# Patient Record
Sex: Female | Born: 1965
Health system: Southern US, Community
[De-identification: ages and names within clinical notes are randomized; demographics above are authoritative.]

## PROBLEM LIST (undated history)

## (undated) DIAGNOSIS — C44319 Basal cell carcinoma of skin of other parts of face: Secondary | ICD-10-CM

## (undated) DIAGNOSIS — F419 Anxiety disorder, unspecified: Secondary | ICD-10-CM

## (undated) DIAGNOSIS — J40 Bronchitis, not specified as acute or chronic: Secondary | ICD-10-CM

## (undated) DIAGNOSIS — H8110 Benign paroxysmal vertigo, unspecified ear: Secondary | ICD-10-CM

## (undated) DIAGNOSIS — C50919 Malignant neoplasm of unspecified site of unspecified female breast: Secondary | ICD-10-CM

## (undated) DIAGNOSIS — T7840XA Allergy, unspecified, initial encounter: Secondary | ICD-10-CM

## (undated) DIAGNOSIS — C44211 Basal cell carcinoma of skin of unspecified ear and external auricular canal: Secondary | ICD-10-CM

## (undated) HISTORY — PX: CERVICAL CONE BIOPSY: SUR198

## (undated) HISTORY — DX: Allergy, unspecified, initial encounter: T78.40XA

## (undated) HISTORY — PX: BREAST SURGERY: SHX581

## (undated) HISTORY — DX: Basal cell carcinoma of skin of unspecified ear and external auricular canal: C44.211

## (undated) HISTORY — DX: Basal cell carcinoma of skin of other parts of face: C44.319

## (undated) HISTORY — PX: MELANOMA EXCISION: SHX5266

## (undated) HISTORY — DX: Benign paroxysmal vertigo, unspecified ear: H81.10

---

## 1998-10-11 ENCOUNTER — Inpatient Hospital Stay (HOSPITAL_COMMUNITY): Admission: AD | Admit: 1998-10-11 | Discharge: 1998-10-11 | Payer: Self-pay | Admitting: Obstetrics and Gynecology

## 1998-12-13 ENCOUNTER — Inpatient Hospital Stay (HOSPITAL_COMMUNITY): Admission: AD | Admit: 1998-12-13 | Discharge: 1998-12-17 | Payer: Self-pay | Admitting: Obstetrics and Gynecology

## 1999-01-18 ENCOUNTER — Other Ambulatory Visit: Admission: RE | Admit: 1999-01-18 | Discharge: 1999-01-18 | Payer: Self-pay | Admitting: Obstetrics and Gynecology

## 2000-02-12 ENCOUNTER — Other Ambulatory Visit: Admission: RE | Admit: 2000-02-12 | Discharge: 2000-02-12 | Payer: Self-pay | Admitting: Obstetrics and Gynecology

## 2000-06-08 HISTORY — PX: NEUROMA SURGERY: SHX722

## 2000-06-13 ENCOUNTER — Ambulatory Visit (HOSPITAL_BASED_OUTPATIENT_CLINIC_OR_DEPARTMENT_OTHER): Admission: RE | Admit: 2000-06-13 | Discharge: 2000-06-13 | Payer: Self-pay | Admitting: Orthopedic Surgery

## 2000-06-13 ENCOUNTER — Encounter (INDEPENDENT_AMBULATORY_CARE_PROVIDER_SITE_OTHER): Payer: Self-pay | Admitting: *Deleted

## 2001-03-13 ENCOUNTER — Other Ambulatory Visit: Admission: RE | Admit: 2001-03-13 | Discharge: 2001-03-13 | Payer: Self-pay | Admitting: Obstetrics and Gynecology

## 2002-03-31 ENCOUNTER — Other Ambulatory Visit: Admission: RE | Admit: 2002-03-31 | Discharge: 2002-03-31 | Payer: Self-pay | Admitting: Obstetrics and Gynecology

## 2003-04-01 ENCOUNTER — Other Ambulatory Visit: Admission: RE | Admit: 2003-04-01 | Discharge: 2003-04-01 | Payer: Self-pay | Admitting: Obstetrics and Gynecology

## 2003-07-16 ENCOUNTER — Encounter (INDEPENDENT_AMBULATORY_CARE_PROVIDER_SITE_OTHER): Payer: Self-pay | Admitting: Specialist

## 2003-07-16 ENCOUNTER — Ambulatory Visit (HOSPITAL_COMMUNITY): Admission: RE | Admit: 2003-07-16 | Discharge: 2003-07-16 | Payer: Self-pay | Admitting: Plastic Surgery

## 2003-07-16 ENCOUNTER — Ambulatory Visit (HOSPITAL_BASED_OUTPATIENT_CLINIC_OR_DEPARTMENT_OTHER): Admission: RE | Admit: 2003-07-16 | Discharge: 2003-07-16 | Payer: Self-pay | Admitting: Plastic Surgery

## 2004-01-09 ENCOUNTER — Encounter: Payer: Self-pay | Admitting: Family Medicine

## 2004-01-09 LAB — CONVERTED CEMR LAB

## 2004-05-08 ENCOUNTER — Other Ambulatory Visit: Admission: RE | Admit: 2004-05-08 | Discharge: 2004-05-08 | Payer: Self-pay | Admitting: Obstetrics and Gynecology

## 2004-06-29 ENCOUNTER — Ambulatory Visit: Payer: Self-pay | Admitting: Family Medicine

## 2005-02-19 ENCOUNTER — Ambulatory Visit: Payer: Self-pay | Admitting: Family Medicine

## 2005-03-14 ENCOUNTER — Ambulatory Visit: Payer: Self-pay | Admitting: Family Medicine

## 2005-05-11 ENCOUNTER — Ambulatory Visit: Payer: Self-pay | Admitting: Family Medicine

## 2005-10-01 ENCOUNTER — Ambulatory Visit: Payer: Self-pay | Admitting: Family Medicine

## 2006-01-02 ENCOUNTER — Ambulatory Visit: Payer: Self-pay | Admitting: Family Medicine

## 2006-08-05 ENCOUNTER — Encounter: Payer: Self-pay | Admitting: Family Medicine

## 2006-08-05 DIAGNOSIS — J309 Allergic rhinitis, unspecified: Secondary | ICD-10-CM | POA: Insufficient documentation

## 2006-08-05 DIAGNOSIS — J45909 Unspecified asthma, uncomplicated: Secondary | ICD-10-CM | POA: Insufficient documentation

## 2006-08-05 DIAGNOSIS — Z85828 Personal history of other malignant neoplasm of skin: Secondary | ICD-10-CM | POA: Insufficient documentation

## 2006-09-09 HISTORY — PX: BASAL CELL CARCINOMA EXCISION: SHX1214

## 2006-10-08 ENCOUNTER — Ambulatory Visit: Payer: Self-pay | Admitting: Family Medicine

## 2006-10-15 ENCOUNTER — Encounter (INDEPENDENT_AMBULATORY_CARE_PROVIDER_SITE_OTHER): Payer: Self-pay | Admitting: *Deleted

## 2006-10-18 ENCOUNTER — Ambulatory Visit: Payer: Self-pay | Admitting: Family Medicine

## 2006-10-21 LAB — CONVERTED CEMR LAB
Basophils Relative: 0.4 % (ref 0.0–1.0)
Bilirubin, Direct: 0.1 mg/dL (ref 0.0–0.3)
CO2: 28 meq/L (ref 19–32)
Creatinine, Ser: 0.9 mg/dL (ref 0.4–1.2)
Eosinophils Relative: 1.1 % (ref 0.0–5.0)
GFR calc Af Amer: 89 mL/min
Glucose, Bld: 96 mg/dL (ref 70–99)
HCT: 34.9 % — ABNORMAL LOW (ref 36.0–46.0)
HDL: 73.3 mg/dL (ref >39.0)
Hemoglobin: 11.8 g/dL — ABNORMAL LOW (ref 12.0–15.0)
Lymphocytes Relative: 25.2 % (ref 12.0–46.0)
Monocytes Absolute: 0.6 10*3/uL (ref 0.2–0.7)
Neutro Abs: 4.5 10*3/uL (ref 1.4–7.7)
Neutrophils Relative %: 64 % (ref 43.0–77.0)
Potassium: 4.4 meq/L (ref 3.5–5.1)
RDW: 11.9 % (ref 11.5–14.6)
Total Bilirubin: 0.5 mg/dL (ref 0.3–1.2)
Total Protein: 6.7 g/dL (ref 6.0–8.3)
VLDL: 17 mg/dL (ref 0–40)
WBC: 6.9 10*3/uL (ref 4.5–10.5)

## 2006-12-23 ENCOUNTER — Ambulatory Visit: Payer: Self-pay | Admitting: Family Medicine

## 2007-01-16 ENCOUNTER — Ambulatory Visit: Payer: Self-pay | Admitting: Family Medicine

## 2007-09-22 ENCOUNTER — Ambulatory Visit: Payer: Self-pay | Admitting: Family Medicine

## 2007-10-15 ENCOUNTER — Ambulatory Visit: Payer: Self-pay | Admitting: Family Medicine

## 2007-12-29 ENCOUNTER — Telehealth: Payer: Self-pay | Admitting: Family Medicine

## 2008-04-01 ENCOUNTER — Ambulatory Visit: Payer: Self-pay | Admitting: Family Medicine

## 2008-04-02 ENCOUNTER — Telehealth (INDEPENDENT_AMBULATORY_CARE_PROVIDER_SITE_OTHER): Payer: Self-pay | Admitting: *Deleted

## 2008-04-05 ENCOUNTER — Ambulatory Visit: Payer: Self-pay | Admitting: Family Medicine

## 2008-04-29 ENCOUNTER — Ambulatory Visit: Payer: Self-pay | Admitting: Family Medicine

## 2008-08-02 ENCOUNTER — Telehealth: Payer: Self-pay | Admitting: Family Medicine

## 2008-08-25 ENCOUNTER — Ambulatory Visit (HOSPITAL_COMMUNITY): Admission: RE | Admit: 2008-08-25 | Discharge: 2008-08-25 | Payer: Self-pay | Admitting: Obstetrics and Gynecology

## 2008-09-23 ENCOUNTER — Ambulatory Visit (HOSPITAL_COMMUNITY): Admission: RE | Admit: 2008-09-23 | Discharge: 2008-09-23 | Payer: Self-pay | Admitting: Obstetrics and Gynecology

## 2008-09-23 ENCOUNTER — Encounter (INDEPENDENT_AMBULATORY_CARE_PROVIDER_SITE_OTHER): Payer: Self-pay | Admitting: Obstetrics and Gynecology

## 2008-12-07 ENCOUNTER — Ambulatory Visit (HOSPITAL_BASED_OUTPATIENT_CLINIC_OR_DEPARTMENT_OTHER): Admission: RE | Admit: 2008-12-07 | Discharge: 2008-12-07 | Payer: Self-pay | Admitting: Plastic Surgery

## 2009-02-28 ENCOUNTER — Ambulatory Visit: Payer: Self-pay | Admitting: Family Medicine

## 2009-02-28 DIAGNOSIS — H8110 Benign paroxysmal vertigo, unspecified ear: Secondary | ICD-10-CM | POA: Insufficient documentation

## 2010-01-30 ENCOUNTER — Encounter: Payer: Self-pay | Admitting: Obstetrics and Gynecology

## 2010-02-07 NOTE — Assessment & Plan Note (Signed)
Summary: ROA FOR VERTIGO SYMPTOMS/JRR   Vital Signs:  Patient profile:   45 year old female Height:      69.25 inches Weight:      200.75 pounds BMI:     29.54 Temp:     97.8 degrees F oral Pulse rate:   64 / minute Pulse rhythm:   regular BP sitting:   90 / 60  (right arm) Cuff size:   regular  Vitals Entered By: Linde Gillis CMA Duncan Dull) (February 28, 2009 8:59 AM) CC: follow-up visit, vertigo   History of Present Illness: she has had vertigo throughout life on and off  is sensitive to it with age   seems worse lately with swimming  ? water in her ears  no congestion or allergy symptoms however   vertigo -- feels like she spins or sways  did vomit once at swim practice -- nausea occasionally  tries not to turn head fast  even moving eyes from mirrior to mirror in car bothers her   will be flying to [place] in april - will need meds for play  has taken vistaril in past for boat rides  shot in the past too   has never went to ENT  also had some general hearing loss  Allergies: No Known Drug Allergies  Past History:  Past Medical History: Last updated: 10/08/2006 Allergic rhinitis Asthma- cough Skin cancer, hx of - basal cell on forehead, and L ear  Past Surgical History: Last updated: 10/08/2006 Cervical conization Pre melanoma removed Caesarean section (12/1998) Neuroma removed from finger (06/2000) 9/08 basal cell skin ca L ear  Family History: Last updated: 10/08/2006 Father:  Mother: asthma Siblings: 1 sister GM CHF GF stomach ca MGM breast ca at 21, uterine ca  Social History: Last updated: 10/15/2007 Marital Status: Married Children:  Occupation: [employer]  non smoker   Risk Factors: Smoking Status: never (08/05/2006)  Review of Systems General:  Denies chills, fatigue, fever, loss of appetite, and malaise. Eyes:  Denies blurring, double vision, eye pain, vision loss-1 eye, and vision loss-both eyes. ENT:  Complains of postnasal  drainage; denies earache and sinus pressure. CV:  Denies chest pain or discomfort and palpitations. Resp:  Denies cough and wheezing. GI:  Denies abdominal pain, change in bowel habits, and indigestion. Derm:  Denies itching, lesion(s), poor wound healing, and rash. Neuro:  Denies numbness and tingling. Endo:  Denies cold intolerance, excessive thirst, excessive urination, and heat intolerance. Heme:  Denies abnormal bruising and bleeding.  Physical Exam  General:  Well-developed,well-nourished,in no acute distress; alert,appropriate and cooperative throughout examination Head:  normocephalic, atraumatic, and no abnormalities observed.  no sinus or temporal tenderness  Eyes:  vision grossly intact, pupils equal, pupils round, pupils reactive to light, and no injection.   grossly nl fundi 2-3 beats of horiz nystagmus bilat  Ears:  R ear normal and L ear normal.   Nose:  no nasal discharge.   Mouth:  pharynx pink and moist.   Neck:  supple with full rom and no masses or thyromegally, no JVD or carotid bruit  Chest Wall:  No deformities, masses, or tenderness noted. Lungs:  Normal respiratory effort, chest expands symmetrically. Lungs are clear to auscultation, no crackles or wheezes. Heart:  Normal rate and regular rhythm. S1 and S2 normal without gallop, murmur, click, rub or other extra sounds. Abdomen:  soft, non-tender, and normal bowel sounds.   Neurologic:  No cranial nerve deficits noted. Station and gait are normal. Plantar  reflexes are down-going bilaterally. DTRs are symmetrical throughout. Sensory, motor and coordinative functions appear intact. Skin:  Intact without suspicious lesions or rashes Cervical Nodes:  No lymphadenopathy noted Psych:  normal affect, talkative and pleasant    Impression & Recommendations:  Problem # 1:  BENIGN POSITIONAL VERTIGO (ICD-386.11) Assessment Deteriorated ongoing - intermittent and worse with swimming/ pressure in ear/ head  turning travel upcoming also some hearing complaints refil vistaril- which works well for her as needed  ref to ENT- may benefit from some maneuvers for vertigo Orders: ENT Referral (ENT)  Complete Medication List: 1)  Zantac 150 Mg Caps (Ranitidine hcl) .Marland Kitchen.. 1 by mouth two times a day for 14 days 2)  Vistaril 25 Mg Caps (Hydroxyzine pamoate) .Marland Kitchen.. 1 by mouth up to three times a day as needed vertigo  warning - may sedate  Patient Instructions: 1)  I sent vistaril px to [facility] pharmacy  2)  wear ear plugs to swim  3)  we will do ENT consult at check out  4)  update me if symptoms worsen Prescriptions: VISTARIL 25 MG CAPS (HYDROXYZINE PAMOATE) 1 by mouth up to three times a day as needed vertigo  warning - may sedate  #30 x 1   Entered and Authorized by:   Judith Part MD   Signed by:   Judith Part MD on 02/28/2009   Method used:   Electronically to        [facility]* (retail)       [address]       [place], [place]  [address]       Ph: [phone]       Fax: [phone]   RxID:   [id number]   Prior Medications: ZANTAC 150 MG CAPS (RANITIDINE HCL) 1 by mouth two times a day for 14 days VISTARIL 25 MG CAPS (HYDROXYZINE PAMOATE) 1 by mouth up to three times a day as needed vertigo  warning - may sedate Current Allergies: No known allergies

## 2010-05-26 NOTE — Op Note (Signed)
[facility]  Patient:    Nichole Arnold, Nichole Arnold                    MRN: [id number] Proc. Date: 06/13/00 Attending:  Nicki Reaper, M.D.                           Operative Report  PREOPERATIVE DIAGNOSIS: Mass, left middle finger.  POSTOPERATIVE DIAGNOSIS: Mass, left middle finger.  OPERATION/PROCEDURE: Excision of mass of left middle finger.  SURGEON: Nicki Reaper, M.D.  ASSISTANT: R.N.  ANESTHESIA: Forearm-based IV regional.  ANESTHESIOLOGIST: Cliffton Asters. Ivin Booty, M.D.  INDICATIONS FOR PROCEDURE: The patient is a 45 year old female with a history of of a mass on the dorsal aspect, just proximal to the PIP joint of the left middle finger, which has been gradually enlarging.  DESCRIPTION OF PROCEDURE: The patient was brought to the operating room, where a forearm-based IV regional anesthetic was carried out without difficulty. She was prepped and draped using Betadine scrubbing solution with the left arm free.  A curvilinear incision was made directly over the mass at the PIP joint and carried down through the subcutaneous tissue.  Bleeders were electrocauterized.  The mass was pearly white, multilobular.  This was excised in toto.  There was feeding small artery and nerve and these were coagulated. The wound was irrigated and the skin closed with interrupted 5-0 nylon suture. A sterile compressive dressing was applied to the finger.  The patient tolerated the procedure well and was taken to the recovery room for observation in satisfactory condition.  She is discharged home to return to the [facility] in one week, on Tylenol #3 and Keflex. DD:  06/13/00 TD:  06/13/00 Job: [id number]

## 2010-05-26 NOTE — Op Note (Signed)
[facility]  Patient:    Nichole Arnold                   MRN: [id number] Proc. Date: 12/14/98 Adm. Date:  [id number] Attending:  Trevor Iha                           Operative Report  PREOPERATIVE DIAGNOSIS:       Intrauterine pregnancy at 39 weeks, arrest of descent.  POSTOPERATIVE DIAGNOSIS:      Intrauterine pregnancy at 39 weeks, arrest of descent.  Plus occipitoposterior presentation.  OPERATION:                    Primary low transverse cesarean section.  SURGEON:                      Trevor Iha, M.D.  ASSISTANT:  ANESTHESIA:                   Epidural.  ESTIMATED BLOOD LOSS:  INDICATIONS:                  Nichole Arnold is a 45 year old gravida 1 at 55 weeks who presented in active labor.  She progressed to 4 to 5 cm dilated and despite  adequate contractions as measured by intrauterine pressure catheter on Pitocin, did not progress beyond 4 to 5 cm dilated and 0 station.  Because of arrest of descent, proceeded with primary low transverse cesarean section.  The risks and benefits  were discussed.  Informed consent was obtained.  FINDINGS:                     Viable female infant, Apgars 9 and 9.  Weight 9 pounds 4 ounces.  The pH was 7.34.  DESCRIPTION OF PROCEDURE:     After adequate analgesia, the patient was placed n the supine position with a left lateral tilt.  She is sterilely prepped and draped. A Pfannenstiel skin incision was made two fingerbreadths above the pubic symphysis, taken down sharply in the midline, incised transversely, extended superiorly and inferiorly off the bellies of the rectus muscle, separated sharply in the midline, and the peritoneum was entered sharply.  The bladder blade was placed.  The uterine serosa was elevated, nicked and incised transversely.  The bladder flap was created. A low segment myotomy incision was made down to the infants vertex, extended laterally with  the operators fingertips.  The infants vertex was delivered atraumatically.  The nares and pharynx were suctioned.  The infant was then delivered.  The cord was clamped.  The infant was handed to pediatricians.  Good cry was noted.  Cord bloods were obtained.  The placenta was extracted manually.  The uterus was exteriorized, wiped clean with dry lap, the myotomy incision closed in two layers, the first being a running locking layer, the second being an imbricating layer of 0 Monocryl.  Good hemostasis was achieved. Normal tubes, ovaries, and uterus were noted.  The uterus was placed back into the peritoneal cavity after a copious amount of irrigation and adequate hemostasis as assured.  The peritoneum was closed with 0 Monocryl.  The rectus muscles were plicated in the midline.  Irrigation was applied and after adequate hemostasis, the fascia was closed with 0 Vicryl in a running fashion.  Irrigation was applied again and after adequate hemostasis, the skin was stapled  and Steri-Strips was applied. The patient tolerated the procedure well and was stable on transfer to the recovery room.  Estimated blood loss was 800 cc.  The patient received 1 gram of Cefotetan after delivery of the placenta. DD:  12/14/98 TD:  12/14/98 Job: [id number]

## 2010-11-06 ENCOUNTER — Other Ambulatory Visit: Payer: Self-pay | Admitting: Dermatology

## 2010-11-09 ENCOUNTER — Encounter: Payer: Self-pay | Admitting: Family Medicine

## 2010-11-09 ENCOUNTER — Ambulatory Visit (INDEPENDENT_AMBULATORY_CARE_PROVIDER_SITE_OTHER): Payer: PRIVATE HEALTH INSURANCE | Admitting: Family Medicine

## 2010-11-09 VITALS — BP 118/72 | HR 70 | Temp 98.6°F | Ht 71.0 in | Wt 210.1 lb

## 2010-11-09 DIAGNOSIS — J9801 Acute bronchospasm: Secondary | ICD-10-CM | POA: Insufficient documentation

## 2010-11-09 DIAGNOSIS — J069 Acute upper respiratory infection, unspecified: Secondary | ICD-10-CM

## 2010-11-09 DIAGNOSIS — Z639 Problem related to primary support group, unspecified: Secondary | ICD-10-CM | POA: Insufficient documentation

## 2010-11-09 MED ORDER — HYDROCOD POLST-CHLORPHEN POLST 10-8 MG/5ML PO LQCR: 5.0000 mL | Freq: Every evening | ORAL | Status: DC | PRN

## 2010-11-09 MED ORDER — ALPRAZOLAM 0.25 MG PO TABS: 0.2500 mg | ORAL_TABLET | Freq: Every evening | ORAL | Status: AC | PRN

## 2010-11-09 NOTE — Assessment & Plan Note (Addendum)
Recent stress with mother's illness leading to worsening insomnia. Provided xanax 0.25mg  prn insomnia. Pt knows not to mix with tussionex.

## 2010-11-09 NOTE — Assessment & Plan Note (Signed)
Anticipate viral URTI.   Supportive care. Provided with tussionex for cough. Red flags to notify us for abx discussed (fever >101.5, worsening productive cough, etc).

## 2010-11-09 NOTE — Progress Notes (Signed)
  Subjective:    Patient ID: Nichole Arnold, female    DOB: Nov 25, 1965, 45 y.o.   MRN: [id number]  HPI CC: cold  1d h/o cold sxs - sneezing, coughing, congested.  Usually easily gets bronchitis.  Taking advil cold/sinus, airborne.    No fevers/chills, abd pain, n/v.  No smokers at home, no sick contacts at home.  + at work.  No h/o asthma.   Mother coming home from hospital with stage 4 stomach cancer, pt wants to feel better before can take care of mother.  Having trouble sleeping since mother's diagnosis.  Very worried about this, understandably so.  Has been treated in past with xanax 0.25mg , worked well.  Does have family in area.  Tussionex helps with cough.    Review of Systems Per HPI    Objective:   Physical Exam  Nursing note and vitals reviewed. Constitutional: She appears well-developed and well-nourished. No distress.       congested  HENT:  Head: Normocephalic and atraumatic.  Right Ear: Hearing, tympanic membrane, external ear and ear canal normal.  Left Ear: Hearing, tympanic membrane, external ear and ear canal normal.  Nose: Mucosal edema present. No rhinorrhea. Right sinus exhibits no maxillary sinus tenderness and no frontal sinus tenderness. Left sinus exhibits no maxillary sinus tenderness and no frontal sinus tenderness.  Mouth/Throat: Uvula is midline, oropharynx is clear and moist and mucous membranes are normal. No oropharyngeal exudate, posterior oropharyngeal edema, posterior oropharyngeal erythema or tonsillar abscesses.  Eyes: Conjunctivae and EOM are normal. Pupils are equal, round, and reactive to light. No scleral icterus.  Neck: Normal range of motion. Neck supple. No JVD present. No thyromegaly present.  Cardiovascular: Normal rate, regular rhythm, normal heart sounds and intact distal pulses.   No murmur heard. Pulmonary/Chest: Effort normal and breath sounds normal. No respiratory distress. She has no wheezes. She has no rales.    Lymphadenopathy:    She has no cervical adenopathy.  Skin: Skin is warm and dry. No rash noted.  Psychiatric: She has a normal mood and affect. Her behavior is normal. Judgment and thought content normal.       Assessment & Plan:

## 2010-11-09 NOTE — Patient Instructions (Addendum)
Sounds like you have a viral upper respiratory infection. Viral infections usually take 7-10 days to resolve.  The cough can last several weeks to go away. Use medication as prescribed: tussionex for cough at night. Xanax 0.25mg  will send in #30.  Let us know how you are doing. If worsening congestion, may use simple mucinex with plenty of fluid to break up mucous. May use antihistamine (claritin, allergra, zyrtec) if you want to dry up mucous. Push fluids and plenty of rest. Please return if you are not improving as expected, or if you have high fevers (>101.5) or difficulty swallowing or worsening productive cough. Call clinic with questions.  Good to see you today.

## 2010-12-07 ENCOUNTER — Encounter: Payer: Self-pay | Admitting: Family Medicine

## 2010-12-07 ENCOUNTER — Ambulatory Visit (INDEPENDENT_AMBULATORY_CARE_PROVIDER_SITE_OTHER): Payer: PRIVATE HEALTH INSURANCE | Admitting: Family Medicine

## 2010-12-07 VITALS — BP 104/60 | HR 66 | Temp 98.5°F | Wt 211.8 lb

## 2010-12-07 DIAGNOSIS — J40 Bronchitis, not specified as acute or chronic: Secondary | ICD-10-CM

## 2010-12-07 MED ORDER — AZITHROMYCIN 250 MG PO TABS: ORAL_TABLET | ORAL | Status: AC

## 2010-12-07 MED ORDER — HYDROCOD POLST-CHLORPHEN POLST 10-8 MG/5ML PO LQCR: 5.0000 mL | Freq: Every evening | ORAL | Status: DC | PRN

## 2010-12-07 MED ORDER — ALBUTEROL 90 MCG/ACT IN AERS: 2.0000 | INHALATION_SPRAY | Freq: Four times a day (QID) | RESPIRATORY_TRACT | Status: DC | PRN

## 2010-12-07 NOTE — Assessment & Plan Note (Signed)
Going on almost 1 mo now, not improved. Treat with zpack for bronchitis. Refilled tussionex. Use albuterol regularly for next 2days. Update [place] if sxs not improved after this.

## 2010-12-07 NOTE — Patient Instructions (Signed)
Sounds like upper respiratory infection has become bronchitis. Treat with zpack as well as albuterol inhaler regularly for next 2-3 days. tussionex refilled. watch for fever >101.5, or worsening breathing.  If that happens, let me know.

## 2010-12-07 NOTE — Progress Notes (Signed)
  Subjective:    Patient ID: Nichole Arnold, female    DOB: [birth year], 45 y.o.   MRN: [id number]  HPI CC: cough  Seen 11/09/2010 with dx viral URTI.  Treated with tussionex.  Never fully cleared.  Continuous cough.  Now R>L ear feeling clogged, popping.  Somewhat tender on right.  Cough productive of yellow/green sputum.  Head congestion as well but not as much as chest.  Some HA.  No fevers/chills, SOB, CP.  tussionex helped at night but now out of this.  Also using mucinex.  Recently stayed with mom on Wednesday night at hospice.  In waiting period now.  Recently had 2 migraines, saw chiropractor and that has improved some.  Mild hx asthma in past.  Review of Systems Per HPI    Objective:   Physical Exam  Nursing note and vitals reviewed. Constitutional: She appears well-developed and well-nourished. No distress.  HENT:  Head: Normocephalic and atraumatic.  Right Ear: Hearing, tympanic membrane, external ear and ear canal normal.  Left Ear: Hearing, tympanic membrane, external ear and ear canal normal.  Nose: No mucosal edema or rhinorrhea. Right sinus exhibits no maxillary sinus tenderness and no frontal sinus tenderness. Left sinus exhibits no maxillary sinus tenderness and no frontal sinus tenderness.  Mouth/Throat: Uvula is midline, oropharynx is clear and moist and mucous membranes are normal. No oropharyngeal exudate, posterior oropharyngeal edema, posterior oropharyngeal erythema or tonsillar abscesses.       Mild congestion behind TM R>L  Eyes: Conjunctivae and EOM are normal. Pupils are equal, round, and reactive to light. No scleral icterus.  Neck: Normal range of motion. Neck supple. No JVD present. No thyromegaly present.  Cardiovascular: Normal rate, regular rhythm, normal heart sounds and intact distal pulses.   No murmur heard. Pulmonary/Chest: Effort normal and breath sounds normal. No respiratory distress. She has no wheezes. She has no rales.       LUL with  minimal exp wheeze on forced expiration  Musculoskeletal: She exhibits no edema.  Lymphadenopathy:    She has no cervical adenopathy.  Skin: Skin is warm and dry. No rash noted.  Psychiatric: She has a normal mood and affect.      Assessment & Plan:

## 2011-01-16 ENCOUNTER — Other Ambulatory Visit: Payer: Self-pay | Admitting: Internal Medicine

## 2011-01-16 MED ORDER — HYDROCOD POLST-CHLORPHEN POLST 10-8 MG/5ML PO LQCR: 5.0000 mL | Freq: Every evening | ORAL | Status: DC | PRN

## 2011-01-16 NOTE — Telephone Encounter (Signed)
Rx called in as directed. Patient notified.  

## 2011-01-16 NOTE — Telephone Encounter (Signed)
Patient called and wanted to know if we could refill her tussionex due to she has bronchitis again.  She made an appointment for Thursday @ 12:00 just wanted something for cough.

## 2011-01-16 NOTE — Telephone Encounter (Signed)
May phone in

## 2011-01-18 ENCOUNTER — Encounter: Payer: Self-pay | Admitting: Family Medicine

## 2011-01-18 ENCOUNTER — Ambulatory Visit (INDEPENDENT_AMBULATORY_CARE_PROVIDER_SITE_OTHER): Payer: Self-pay | Admitting: Family Medicine

## 2011-01-18 VITALS — BP 102/60 | HR 70 | Temp 98.4°F | Wt 211.2 lb

## 2011-01-18 DIAGNOSIS — J9801 Acute bronchospasm: Secondary | ICD-10-CM

## 2011-01-18 MED ORDER — PREDNISONE 20 MG PO TABS: ORAL_TABLET | ORAL | Status: DC

## 2011-01-18 NOTE — Assessment & Plan Note (Signed)
In h/o asthma, recent bronchitis. Continue albuterol Prescribed steroid taper. Update [place] if not improved after this.

## 2011-01-18 NOTE — Patient Instructions (Signed)
Sounds like residual asthma bronchospasm. Reasonable to do trial of steroid taper. If doesn't help, consider nasal steroid for possible allergic component. Get as much rest as you can. Good to see you today, call us with questions.

## 2011-01-18 NOTE — Progress Notes (Signed)
  Subjective:    Patient ID: Nichole Arnold, female    DOB: Jan 14, 1965, 46 y.o.   MRN: [id number]  HPI CC: cough   Seen 11/09/2010 with dx viral URTI. Treated with tussionex. Never fully cleared. Seen again 11/29 with dx bronchitis.  Treated with zpack and continued tussionex.  Also recommended treat with albuterol regularly.  Here because cough lingering, happens more at night time.  Able to get rest at night.  Takes tussionex at 7-8pm and this helps.  Also has been using albuterol.    Dry cough, no fevers/chills, h/o allergy sxs, but not recently flared.  Denies RN, itchy eyes, congestion, PNdrainage.  No h/o GERD, denies indigestion, acid reflux, heartburn, bloating or gassiness.  Mild hx asthma in past.  Mother passed away 01/03/11.  Significant stress last few months.  Nonstop active.  Behind at work.  Review of Systems Per HPI    Objective:   Physical Exam  Nursing note and vitals reviewed. Constitutional: She appears well-developed and well-nourished. No distress.  HENT:  Head: Normocephalic and atraumatic.  Right Ear: External ear normal.  Left Ear: External ear normal.  Nose: No mucosal edema or rhinorrhea. Right sinus exhibits no maxillary sinus tenderness and no frontal sinus tenderness. Left sinus exhibits no maxillary sinus tenderness and no frontal sinus tenderness.  Mouth/Throat: Uvula is midline, oropharynx is clear and moist and mucous membranes are normal. No oropharyngeal exudate.  Eyes: Conjunctivae and EOM are normal. Pupils are equal, round, and reactive to light. No scleral icterus.  Neck: Normal range of motion. Neck supple.  Cardiovascular: Normal rate, regular rhythm and intact distal pulses.   Murmur (1-2/6 SEM) heard. Pulmonary/Chest: Effort normal and breath sounds normal. No respiratory distress. She has no wheezes. She has no rales.       Overall clear  Lymphadenopathy:    She has no cervical adenopathy.  Skin: Skin is warm and dry. No rash noted.        Assessment & Plan:

## 2011-01-31 ENCOUNTER — Telehealth: Payer: Self-pay | Admitting: *Deleted

## 2011-01-31 ENCOUNTER — Other Ambulatory Visit: Payer: Self-pay | Admitting: *Deleted

## 2011-01-31 MED ORDER — HYDROCOD POLST-CHLORPHEN POLST 10-8 MG/5ML PO LQCR: 5.0000 mL | Freq: Every evening | ORAL | Status: DC | PRN

## 2011-01-31 NOTE — Telephone Encounter (Signed)
[place] [facility] called to make you aware that patient has had 6 Tussionex refills since November. She was given 140 mL by [place] on 11-09-10 and again on 12-04-10. She then got 240 ml from Dr. Richardean Chimera on 12-18-10. She only filled 1/2 and then came back for the other half on 12-29-10. She then got another 140 mL (she only picked up 120 mL) from [place] on 01-16-11 and then requested the refill again today for another 140 mL. I told her to hold today's refill until I made you aware and told her I would call back tomorrow with what to do.

## 2011-01-31 NOTE — Telephone Encounter (Signed)
Noted. Can we call pt and see how cough is doing?  Did steroid taper help at all? Ok to refill tussionex again but i want her to start taking controller medicine for asthma in case recent deterioration of asthma is cause of cough (may provide with flovent discus sample for bid every day).  Do this for a month and update [place] on how she's doing.

## 2011-01-31 NOTE — Telephone Encounter (Signed)
plz phone in. 

## 2011-01-31 NOTE — Telephone Encounter (Signed)
Rx called in as directed.   

## 2011-01-31 NOTE — Telephone Encounter (Signed)
Pharmacy sent request for refill. Ok to do?

## 2011-02-01 NOTE — Telephone Encounter (Signed)
Message left for patient to return my call.  

## 2011-02-01 NOTE — Telephone Encounter (Signed)
Spoke with patient. She said the prednisone really helped and she was feeling better and then she kept her sister's kids and got sick again. I advised to come by and pick up flovent sample and gave her instructions and advised Tussionex sent in to pharmacy. I instructed to call back with an update in 1 month. She verbalized understanding.

## 2011-02-14 ENCOUNTER — Ambulatory Visit (INDEPENDENT_AMBULATORY_CARE_PROVIDER_SITE_OTHER): Payer: 59 | Admitting: Family Medicine

## 2011-02-14 ENCOUNTER — Encounter: Payer: Self-pay | Admitting: Family Medicine

## 2011-02-14 VITALS — BP 94/60 | HR 84 | Temp 98.3°F | Ht 71.0 in | Wt 211.2 lb

## 2011-02-14 DIAGNOSIS — J020 Streptococcal pharyngitis: Secondary | ICD-10-CM

## 2011-02-14 DIAGNOSIS — J029 Acute pharyngitis, unspecified: Secondary | ICD-10-CM

## 2011-02-14 LAB — POCT RAPID STREP A (OFFICE): Rapid Strep A Screen: POSITIVE — AB

## 2011-02-14 MED ORDER — AMOXICILLIN 500 MG PO CAPS: 500.0000 mg | ORAL_CAPSULE | Freq: Three times a day (TID) | ORAL | Status: AC

## 2011-02-14 NOTE — Patient Instructions (Signed)
Drink lots of fluids  Tylenol for pain and fever if needed  Take the amoxicillin as directed  Update if not starting to improve in a week or if worsening

## 2011-02-14 NOTE — Progress Notes (Signed)
Subjective:    Patient ID: Nichole Arnold, female    DOB: 1965-03-17, 46 y.o.   MRN: [id number]  HPI Here with a really bad sore throat -- started yesterday with fever and then st  Severe st today  Strep test pos  Headache and nausea  No chance pregnant    (Essure procedure)   Has not taken meds today In bed resting   Does have injured finger-- fractured - is casted   Patient Active Problem List  Diagnoses  . BENIGN POSITIONAL VERTIGO  . ALLERGIC RHINITIS  . ASTHMA  . SKIN CANCER, HX OF  . Bronchospasm  . Unspecified family circumstance  . Strep throat   Past Medical History  Diagnosis Date  . Allergic rhinitis   . Asthma   . Basal cell carcinoma of forehead   . Basal cell carcinoma of ear     Left  . Benign paroxysmal positional vertigo    Past Surgical History  Procedure Date  . Cervical cone biopsy   . Melanoma excision     pre melanoma  . Cesarean section 12/00  . Neuroma surgery 6/02    finger  . Basal cell carcinoma excision 9/08    left ear   History  Substance Use Topics  . Smoking status: Never Smoker   . Smokeless tobacco: Not on file  . Alcohol Use: Yes     Occasional   Family History  Problem Relation Age of Onset  . Asthma Mother   . Heart failure      GM  . Stomach cancer      GF  . Breast cancer Maternal Grandmother 60  . Uterine cancer Maternal Grandmother    No Known Allergies Current Outpatient Prescriptions on File Prior to Visit  Medication Sig Dispense Refill  . albuterol (PROVENTIL,VENTOLIN) 90 MCG/ACT inhaler Inhale 2 puffs into the lungs every 6 (six) hours as needed for wheezing.  17 g  12  . chlorpheniramine-HYDROcodone (TUSSIONEX) 10-8 MG/5ML LQCR Take 5 mLs by mouth at bedtime as needed. Sedation precautions  180 mL  0  . predniSONE (DELTASONE) 20 MG tablet Take 3 for 3 days then 2 for 3 days then 1 for 3 days  18 tablet  0        Review of Systems Review of Systems  Constitutional: Negative for  unexpected  weight change. pos for fever and malaise Eyes: Negative for pain and visual disturbance.  ENT pos for st, neg for sinus pain or nasal cong Respiratory: Negative for cough and shortness of breath.   Cardiovascular: Negative for cp or palpitations    Gastrointestinal: Negative for nausea, diarrhea and constipation.  Genitourinary: Negative for urgency and frequency.  Skin: Negative for pallor or rash   Neurological: Negative for weakness, light-headedness, numbness and headaches.  Hematological: Negative for adenopathy. Does not bruise/bleed easily.  Psychiatric/Behavioral: Negative for dysphoric mood. The patient is not nervous/anxious.          Objective:   Physical Exam  Constitutional: She appears well-developed and well-nourished. No distress.  HENT:  Head: Normocephalic and atraumatic.  Right Ear: External ear normal.  Left Ear: External ear normal.  Nose: Nose normal.  Mouth/Throat: No oropharyngeal exudate.       Throat diffusely erythematous  No swelling or lesions  Eyes: Conjunctivae and EOM are normal. Pupils are equal, round, and reactive to light. Right eye exhibits no discharge. Left eye exhibits no discharge.  Neck: Normal range of motion. Neck supple.  Cardiovascular: Normal rate, regular rhythm and normal heart sounds.   Pulmonary/Chest: Effort normal and breath sounds normal. No respiratory distress. She has no wheezes.  Musculoskeletal:       No joint changes  Does have cast on R hand  Lymphadenopathy:    She has no cervical adenopathy.  Skin: Skin is warm and dry. No rash noted. No erythema. No pallor.  Psychiatric: She has a normal mood and affect.          Assessment & Plan:

## 2011-02-14 NOTE — Assessment & Plan Note (Signed)
With st and fever and ha (no rash) tx with amox  Disc symptomatic care - see instructions on AVS  Update if not starting to improve in a week or if worsening

## 2011-05-14 ENCOUNTER — Telehealth: Payer: Self-pay | Admitting: Family Medicine

## 2011-05-14 NOTE — Telephone Encounter (Signed)
Pt is stating that she had lab work recently that came back that she had elevated liver function  and they told her to have her liver rechecked at her primary office. This is for [employer]. She is going to call the place that had the labs done and get [place] a copy of those labs as well.

## 2011-05-14 NOTE — Telephone Encounter (Signed)
Please request those labs and then send this message back to me when they arrive, thanks

## 2011-05-14 NOTE — Telephone Encounter (Signed)
Noted  

## 2011-05-15 NOTE — Telephone Encounter (Signed)
Labs are on your desk.  

## 2011-05-16 NOTE — Telephone Encounter (Signed)
Thank you, it looks like her ast and alt are high at 211 and 121- I need to see her for this Tell her to avoid alcohol and also otc pain meds like tylenol or any herbs Please schedule f/u  I will hold this on my desk and hold from scanning until I see her , thanks

## 2011-05-16 NOTE — Telephone Encounter (Signed)
Patient advised as instructed via telephone, appt scheduled for 05/18/2011 at 9:00 am.

## 2011-05-18 ENCOUNTER — Encounter: Payer: Self-pay | Admitting: Family Medicine

## 2011-05-18 ENCOUNTER — Ambulatory Visit (INDEPENDENT_AMBULATORY_CARE_PROVIDER_SITE_OTHER): Payer: 59 | Admitting: Family Medicine

## 2011-05-18 VITALS — BP 108/64 | HR 72 | Temp 97.7°F | Wt 202.8 lb

## 2011-05-18 DIAGNOSIS — R748 Abnormal levels of other serum enzymes: Secondary | ICD-10-CM

## 2011-05-18 DIAGNOSIS — R7401 Elevation of levels of liver transaminase levels: Secondary | ICD-10-CM

## 2011-05-18 LAB — CBC WITH DIFFERENTIAL/PLATELET
Basophils Relative: 0.5 % (ref 0.0–3.0)
Eosinophils Relative: 1.9 % (ref 0.0–5.0)
MCV: 93.9 fl (ref 78.0–100.0)
Monocytes Absolute: 0.7 10*3/uL (ref 0.1–1.0)
Monocytes Relative: 11.7 % (ref 3.0–12.0)
Neutrophils Relative %: 48.4 % (ref 43.0–77.0)
Platelets: 320 10*3/uL (ref 150.0–400.0)
RBC: 4.05 Mil/uL (ref 3.87–5.11)
WBC: 5.7 10*3/uL (ref 4.5–10.5)

## 2011-05-18 LAB — HEPATIC FUNCTION PANEL
ALT: 37 U/L — ABNORMAL HIGH (ref 0–35)
AST: 32 U/L (ref 0–37)
Albumin: 4 g/dL (ref 3.5–5.2)
Total Bilirubin: 0.4 mg/dL (ref 0.3–1.2)

## 2011-05-18 NOTE — Assessment & Plan Note (Signed)
?   Cause Asymptomatic Neg hepatitis tests  Re check today, also cbc  If abn will do liver US  Disc avoiding supplements and alcohol and otc med for now

## 2011-05-18 NOTE — Progress Notes (Signed)
Subjective:    Patient ID: Nichole Arnold, female    DOB: [birth year], 46 y.o.   MRN: [id number]  HPI Here for elevated liver enzymes   ast 211/ ALT 121--- screening  For program  Totally new Other liver tests are normal  Hepatitis tests are normal  Working out hard   With weight loss plan- there was a "liver detox pills", no appetite suppressant Protein meal repl shakes/ prot bars/ fruit / salads Practically no fat in diet   occ alcohol  Once in a while once glass of wine   No tylenol otc  occ advil - not a lot or asa Zyrtec occas   No abdominal pain/ and no jaundice   Patient Active Problem List  Diagnoses  . BENIGN POSITIONAL VERTIGO  . ALLERGIC RHINITIS  . ASTHMA  . SKIN CANCER, HX OF  . Bronchospasm  . Unspecified family circumstance  . Strep throat   Past Medical History  Diagnosis Date  . Allergic rhinitis   . Asthma   . Basal cell carcinoma of forehead   . Basal cell carcinoma of ear     Left  . Benign paroxysmal positional vertigo    Past Surgical History  Procedure Date  . Cervical cone biopsy   . Melanoma excision     pre melanoma  . Cesarean section 12/00  . Neuroma surgery 6/02    finger  . Basal cell carcinoma excision 9/08    left ear   History  Substance Use Topics  . Smoking status: Never Smoker   . Smokeless tobacco: Not on file  . Alcohol Use: Yes     Occasional   Family History  Problem Relation Age of Onset  . Asthma Mother   . Heart failure      GM  . Stomach cancer      GF  . Breast cancer Maternal Grandmother 106  . Uterine cancer Maternal Grandmother    No Known Allergies Current Outpatient Prescriptions on File Prior to Visit  Medication Sig Dispense Refill  . albuterol (PROVENTIL,VENTOLIN) 90 MCG/ACT inhaler Inhale 2 puffs into the lungs every 6 (six) hours as needed for wheezing.  17 g  12      Review of Systems Review of Systems  Constitutional: Negative for fever, appetite change, fatigue and unexpected  weight change.  Eyes: Negative for pain and visual disturbance.  Respiratory: Negative for cough and shortness of breath.   Cardiovascular: Negative for cp or palpitations    Gastrointestinal: Negative for nausea, diarrhea and constipation.  Genitourinary: Negative for urgency and frequency.  Skin: Negative for pallor or rash   Neurological: Negative for weakness, light-headedness, numbness and headaches.  Hematological: Negative for adenopathy. Does not bruise/bleed easily.  Psychiatric/Behavioral: Negative for dysphoric mood. The patient is not nervous/anxious.          Objective:   Physical Exam  Constitutional: She appears well-developed and well-nourished. No distress.  HENT:  Head: Normocephalic and atraumatic.  Mouth/Throat: Oropharynx is clear and moist.  Eyes: Conjunctivae and EOM are normal. Pupils are equal, round, and reactive to light. No scleral icterus.  Neck: Normal range of motion. Neck supple. No JVD present. No thyromegaly present.  Cardiovascular: Normal rate, regular rhythm, normal heart sounds and intact distal pulses.  Exam reveals no gallop.   Pulmonary/Chest: Breath sounds normal. No respiratory distress. She has no wheezes.  Abdominal: Soft. Bowel sounds are normal. She exhibits no distension and no mass. There is no tenderness. There is  no rebound and no guarding.       No HSM  Musculoskeletal: Normal range of motion. She exhibits no edema and no tenderness.  Lymphadenopathy:    She has no cervical adenopathy.  Neurological: She is alert. She has normal reflexes. She displays no tremor. She exhibits normal muscle tone.  Skin: Skin is warm and dry. No rash noted. No erythema. No pallor.       No jaundice   Psychiatric: She has a normal mood and affect.          Assessment & Plan:

## 2011-05-18 NOTE — Patient Instructions (Signed)
Drink lots of water  Stay away from alcohol for now and stay away from herbs or your "liver detox" supplement Let me know if abdominal pain or other symptoms  Lab today  If still abnormal will schedule a liver ultrasound

## 2011-11-08 ENCOUNTER — Other Ambulatory Visit: Payer: Self-pay | Admitting: Dermatology

## 2012-01-28 ENCOUNTER — Ambulatory Visit (INDEPENDENT_AMBULATORY_CARE_PROVIDER_SITE_OTHER): Payer: 59 | Admitting: Family Medicine

## 2012-01-28 ENCOUNTER — Encounter: Payer: Self-pay | Admitting: Family Medicine

## 2012-01-28 VITALS — BP 102/66 | HR 65 | Temp 98.5°F | Ht 71.0 in | Wt 200.5 lb

## 2012-01-28 DIAGNOSIS — J209 Acute bronchitis, unspecified: Secondary | ICD-10-CM | POA: Insufficient documentation

## 2012-01-28 DIAGNOSIS — J029 Acute pharyngitis, unspecified: Secondary | ICD-10-CM

## 2012-01-28 LAB — POCT RAPID STREP A (OFFICE): Rapid Strep A Screen: NEGATIVE

## 2012-01-28 MED ORDER — ALBUTEROL SULFATE HFA 108 (90 BASE) MCG/ACT IN AERS: 2.0000 | INHALATION_SPRAY | RESPIRATORY_TRACT | Status: DC | PRN

## 2012-01-28 MED ORDER — HYDROCOD POLST-CHLORPHEN POLST 10-8 MG/5ML PO LQCR: 5.0000 mL | Freq: Two times a day (BID) | ORAL | Status: DC | PRN

## 2012-01-28 MED ORDER — FLUTICASONE-SALMETEROL 100-50 MCG/DOSE IN AEPB: 1.0000 | INHALATION_SPRAY | Freq: Two times a day (BID) | RESPIRATORY_TRACT | Status: DC

## 2012-01-28 NOTE — Assessment & Plan Note (Signed)
S/p uri with non prod cough Given tussionex with warnings for cough control Adv fluids/ rest  Will start back on advair bid for 2 wk and given rescue albuterol mri for use prn  Update if not starting to improve in a week or if worsening

## 2012-01-28 NOTE — Progress Notes (Signed)
Subjective:    Patient ID: Nichole Arnold, female    DOB: November 08, 1965, 47 y.o.   MRN: [id number]  HPI Here with uri symptoms   Was sick 2 wk ago- 102 temp /aches/cough - went to UC and flu and strep tests neg Dx with sinusitis - took 10 d of amox tid and prednisone (that kept her up all night) Fever went away - felt a little better  Now cough is miserable - thinks she has bronchitis   Cough is hacking and barking  Worse when she lies down at night Rarely prod of mucous  Taking delsym  Took her husbands - robitussin ac did not help    Did not have a flu shot this year   Patient Active Problem List  Diagnosis  . BENIGN POSITIONAL VERTIGO  . ALLERGIC RHINITIS  . ASTHMA  . SKIN CANCER, HX OF  . Bronchospasm  . Unspecified family circumstance  . Strep throat  . Elevated liver enzymes   Past Medical History  Diagnosis Date  . Allergic rhinitis   . Asthma   . Basal cell carcinoma of forehead   . Basal cell carcinoma of ear     Left  . Benign paroxysmal positional vertigo    Past Surgical History  Procedure Date  . Cervical cone biopsy   . Melanoma excision     pre melanoma  . Cesarean section 12/00  . Neuroma surgery 6/02    finger  . Basal cell carcinoma excision 9/08    left ear   History  Substance Use Topics  . Smoking status: Never Smoker   . Smokeless tobacco: Not on file  . Alcohol Use: Yes     Comment: Occasional   Family History  Problem Relation Age of Onset  . Asthma Mother   . Heart failure      GM  . Stomach cancer      GF  . Breast cancer Maternal Grandmother 53  . Uterine cancer Maternal Grandmother    No Known Allergies Current Outpatient Prescriptions on File Prior to Visit  Medication Sig Dispense Refill  . albuterol (PROVENTIL,VENTOLIN) 90 MCG/ACT inhaler Inhale 2 puffs into the lungs every 6 (six) hours as needed for wheezing.  17 g  12            Review of Systems Review of Systems  Constitutional: Negative for  fever, appetite change,  and unexpected weight change.  ENT pos for cong/ rhinorrhea/ st , neg for sinus pain  Eyes: Negative for pain and visual disturbance.  Respiratory: Negative for shortness of breath.pos for mild wheeze   Cardiovascular: Negative for cp or palpitations    Gastrointestinal: Negative for nausea, diarrhea and constipation.  Genitourinary: Negative for urgency and frequency.  Skin: Negative for pallor or rash   Neurological: Negative for weakness, light-headedness, numbness and headaches.  Hematological: Negative for adenopathy. Does not bruise/bleed easily.  Psychiatric/Behavioral: Negative for dysphoric mood. The patient is not nervous/anxious.         Objective:   Physical Exam  Constitutional: She appears well-developed and well-nourished. No distress.  HENT:  Head: Normocephalic and atraumatic.  Right Ear: External ear normal.  Left Ear: External ear normal.  Mouth/Throat: Oropharynx is clear and moist. No oropharyngeal exudate.       Nares are injected and congested  With clear rhinorrhea No sinus tenderness   Eyes: Conjunctivae normal and EOM are normal. Pupils are equal, round, and reactive to light. Right eye exhibits no  discharge. Left eye exhibits no discharge.  Neck: Normal range of motion. Neck supple.  Cardiovascular: Normal rate and regular rhythm.   Pulmonary/Chest: Effort normal. No respiratory distress. She has wheezes. She has no rales. She exhibits no tenderness.       Harsh bs with occ end exp wheezes Good air exch  No rales   Lymphadenopathy:    She has no cervical adenopathy.  Neurological: She is alert.  Skin: Skin is warm and dry. No rash noted.  Psychiatric: She has a normal mood and affect.          Assessment & Plan:

## 2012-01-28 NOTE — Patient Instructions (Addendum)
Take tussionex for cough with caution advair for about 2 weeks Albuterol inhaler for rescue Lots of fluids Try to get some rest  If fever or worse let me know

## 2012-02-07 ENCOUNTER — Other Ambulatory Visit: Payer: Self-pay | Admitting: Family Medicine

## 2012-02-07 NOTE — Telephone Encounter (Signed)
Px written for call in   

## 2012-02-07 NOTE — Telephone Encounter (Signed)
Medication phoned to pharmacy.  

## 2012-02-07 NOTE — Telephone Encounter (Signed)
Pt said she is doing better but still a little sick, she said her cough is a little worse at night, and she feels like she just has this tickle in her throat

## 2012-02-07 NOTE — Telephone Encounter (Signed)
Ok to refill 

## 2012-02-07 NOTE — Telephone Encounter (Signed)
Please ask how her symptoms are before refilling thia

## 2012-08-05 ENCOUNTER — Telehealth: Payer: Self-pay | Admitting: Family Medicine

## 2012-08-05 DIAGNOSIS — Z0000 Encounter for general adult medical examination without abnormal findings: Secondary | ICD-10-CM

## 2012-08-05 NOTE — Telephone Encounter (Signed)
Message copied by Judy Pimple on Tue Aug 05, 2012 10:15 PM ------      Message from: Alvina Chou      Created: Fri Aug 01, 2012 10:43 AM      Regarding: Lab orders for Wednesday 7.30.14       Patient is scheduled for CPX labs, please order future labs, Thanks , Terri       ------

## 2012-08-06 ENCOUNTER — Other Ambulatory Visit (INDEPENDENT_AMBULATORY_CARE_PROVIDER_SITE_OTHER): Payer: 59

## 2012-08-06 DIAGNOSIS — Z0000 Encounter for general adult medical examination without abnormal findings: Secondary | ICD-10-CM

## 2012-08-06 LAB — CBC WITH DIFFERENTIAL/PLATELET
Basophils Relative: 0.4 % (ref 0.0–3.0)
Eosinophils Relative: 1.6 % (ref 0.0–5.0)
HCT: 37.5 % (ref 36.0–46.0)
Hemoglobin: 12.6 g/dL (ref 12.0–15.0)
Lymphs Abs: 2 10*3/uL (ref 0.7–4.0)
MCV: 94.1 fl (ref 78.0–100.0)
Monocytes Absolute: 0.7 10*3/uL (ref 0.1–1.0)
Monocytes Relative: 11.1 % (ref 3.0–12.0)
Neutro Abs: 3.8 10*3/uL (ref 1.4–7.7)
RBC: 3.98 Mil/uL (ref 3.87–5.11)
WBC: 6.7 10*3/uL (ref 4.5–10.5)

## 2012-08-06 LAB — COMPREHENSIVE METABOLIC PANEL
Alkaline Phosphatase: 49 U/L (ref 39–117)
BUN: 14 mg/dL (ref 6–23)
CO2: 25 mEq/L (ref 19–32)
Creatinine, Ser: 0.9 mg/dL (ref 0.4–1.2)
GFR: 68.87 mL/min (ref >60.00)
Glucose, Bld: 90 mg/dL (ref 70–99)
Sodium: 135 mEq/L (ref 135–145)
Total Bilirubin: 0.3 mg/dL (ref 0.3–1.2)
Total Protein: 6.6 g/dL (ref 6.0–8.3)

## 2012-08-11 ENCOUNTER — Encounter: Payer: Self-pay | Admitting: Family Medicine

## 2012-08-11 ENCOUNTER — Ambulatory Visit (INDEPENDENT_AMBULATORY_CARE_PROVIDER_SITE_OTHER): Payer: 59 | Admitting: Family Medicine

## 2012-08-11 VITALS — BP 116/68 | HR 51 | Temp 98.6°F | Ht 70.0 in | Wt 195.0 lb

## 2012-08-11 DIAGNOSIS — Z0000 Encounter for general adult medical examination without abnormal findings: Secondary | ICD-10-CM

## 2012-08-11 DIAGNOSIS — F419 Anxiety disorder, unspecified: Secondary | ICD-10-CM

## 2012-08-11 DIAGNOSIS — Z800 Family history of malignant neoplasm of digestive organs: Secondary | ICD-10-CM

## 2012-08-11 DIAGNOSIS — F411 Generalized anxiety disorder: Secondary | ICD-10-CM

## 2012-08-11 LAB — LIPID PANEL
Cholesterol: 185 mg/dL (ref 0–200)
HDL: 67.5 mg/dL (ref >39.00)
LDL Cholesterol: 99 mg/dL (ref 0–99)
Total CHOL/HDL Ratio: 3
Triglycerides: 92 mg/dL (ref 0.0–149.0)
VLDL: 18.4 mg/dL (ref 0.0–40.0)

## 2012-08-11 LAB — TSH: TSH: 1.29 u[IU]/mL (ref 0.35–5.50)

## 2012-08-11 MED ORDER — ALPRAZOLAM 0.25 MG PO TABS: 0.2500 mg | ORAL_TABLET | Freq: Every day | ORAL | Status: DC | PRN

## 2012-08-11 NOTE — Progress Notes (Signed)
Subjective:    Patient ID: Nichole Arnold, female    DOB: [birth year], 47 y.o.   MRN: [id number]  HPI Here for health maintenance exam and to review chronic medical problems    Is feeling good overall  Taking good care of yourself   Wt is down 5 lb with bmi of 27 She has gone down a pants size -- she is doing the cross fit program  Like a boot camp type program - is a real challenge and that fuels her  She really wants to stay healthy Also eating healthy most of the time (but just got back from the beach)  Feels like she needs xanax for very occas use for anxiety- for emergency situations -- given by her gyn in the past after her mother died (the 1/4 mg dose)-- Dr Arelia Sneddon Some marriage issues and infidelity- working on that  Her grief comes in waves  Her husband will not go to counseling with her   She has had some panic attacks - rapid heart rate/ feels like adrenaline pumps / can't get a deep breath -- lasting about 5 -15 minutes  She does not have those often  Pap -has appt with gyn next Tuesday  Last pap was last July-was ok  Hx of cone bx  Mammogram- last one was nl July 2013  Self exam - no lumps   Td 9/08 Flu vaccine - did not get one this fall  She does tend to get bronchitis    Mood - not depressed- just anxious and worried at times   Nl labs still pending lipid/tsh  Patient Active Problem List   Diagnosis Date Noted  . Routine general medical examination at a health care facility 08/05/2012  . Elevated liver enzymes 05/18/2011  . Unspecified family circumstance 11/09/2010  . ALLERGIC RHINITIS 08/05/2006  . ASTHMA 08/05/2006  . SKIN CANCER, HX OF 08/05/2006   Past Medical History  Diagnosis Date  . Allergic rhinitis   . Asthma   . Basal cell carcinoma of forehead   . Basal cell carcinoma of ear     Left  . Benign paroxysmal positional vertigo    Past Surgical History  Procedure Laterality Date  . Cervical cone biopsy    . Melanoma excision      pre melanoma  . Cesarean section  12/00  . Neuroma surgery  6/02    finger  . Basal cell carcinoma excision  9/08    left ear   History  Substance Use Topics  . Smoking status: Never Smoker   . Smokeless tobacco: Not on file  . Alcohol Use: Yes     Comment: Occasional/ Social   Family History  Problem Relation Age of Onset  . Asthma Mother   . Heart failure      GM  . Stomach cancer      GF  . Breast cancer Maternal Grandmother 2  . Uterine cancer Maternal Grandmother    No Known Allergies Current Outpatient Prescriptions on File Prior to Visit  Medication Sig Dispense Refill  . albuterol (PROVENTIL HFA;VENTOLIN HFA) 108 (90 BASE) MCG/ACT inhaler Inhale 2 puffs into the lungs every 4 (four) hours as needed for wheezing.  1 Inhaler  0   No current facility-administered medications on file prior to visit.     Review of Systems Review of Systems  Constitutional: Negative for fever, appetite change, fatigue and unexpected weight change.  Eyes: Negative for pain and visual disturbance.  Respiratory: Negative  for cough and shortness of breath.   Cardiovascular: Negative for cp or palpitations    Gastrointestinal: Negative for nausea, diarrhea and constipation.  Genitourinary: Negative for urgency and frequency.  Skin: Negative for pallor or rash   Neurological: Negative for weakness, light-headedness, numbness and headaches.  Hematological: Negative for adenopathy. Does not bruise/bleed easily.  Psychiatric/Behavioral: Negative for dysphoric mood. Pos for anxiety with intermittent panic attacks and grief       Objective:   Physical Exam  Constitutional: She appears well-developed and well-nourished. No distress.  HENT:  Head: Normocephalic and atraumatic.  Right Ear: External ear normal.  Left Ear: External ear normal.  Nose: Nose normal.  Mouth/Throat: Oropharynx is clear and moist.  Eyes: Conjunctivae and EOM are normal. Pupils are equal, round, and reactive to  light. Right eye exhibits no discharge. Left eye exhibits no discharge. No scleral icterus.  Neck: Normal range of motion. Neck supple. No JVD present. Carotid bruit is not present. No thyromegaly present.  Cardiovascular: Normal rate, regular rhythm, normal heart sounds and intact distal pulses.  Exam reveals no gallop.   Pulmonary/Chest: Effort normal and breath sounds normal. No respiratory distress. She has no wheezes. She exhibits no tenderness.  Abdominal: Soft. Bowel sounds are normal. She exhibits no distension, no abdominal bruit and no mass. There is no tenderness.  Musculoskeletal: She exhibits no edema and no tenderness.  Lymphadenopathy:    She has no cervical adenopathy.  Neurological: She is alert. She has normal reflexes. No cranial nerve deficit. She exhibits normal muscle tone. Coordination normal.  Skin: Skin is warm and dry. No rash noted. No erythema. No pallor.  Psychiatric: She has a normal mood and affect. Her behavior is normal. Thought content normal. Her mood appears not anxious. She does not exhibit a depressed mood.          Assessment & Plan:

## 2012-08-11 NOTE — Patient Instructions (Addendum)
Keep taking good care of yourself  Use xanax only for extreme anxiety If you are interested in counseling for stress in the future-let me know Don't forget your flu shot in the fall  If panic attacks get more frequent-let me know

## 2012-08-11 NOTE — Assessment & Plan Note (Signed)
Reviewed health habits including diet and exercise and skin cancer prevention Also reviewed health mt list, fam hx and immunizations   Wellness labs reviewed Good cholesterol profile

## 2012-08-11 NOTE — Assessment & Plan Note (Signed)
Pt interested in testing for H pylori Sister also had it  No symptoms Lab today

## 2012-08-11 NOTE — Assessment & Plan Note (Signed)
Stress and grief induced - with infrequent symptoms Urged to consider counseling  Given px for xanax .25mg  for infrequent use-disc sedative potential and habit forming potential as well  If symptoms worsen will f/u

## 2012-08-12 LAB — H. PYLORI ANTIBODY, IGG: H Pylori IgG: NEGATIVE

## 2012-08-13 ENCOUNTER — Encounter: Payer: Self-pay | Admitting: *Deleted

## 2012-09-26 ENCOUNTER — Other Ambulatory Visit: Payer: Self-pay | Admitting: Obstetrics and Gynecology

## 2012-09-26 ENCOUNTER — Encounter (HOSPITAL_COMMUNITY): Payer: Self-pay | Admitting: *Deleted

## 2012-09-26 ENCOUNTER — Inpatient Hospital Stay (HOSPITAL_COMMUNITY)
Admission: AD | Admit: 2012-09-26 | Discharge: 2012-09-26 | Disposition: A | Discharge status: Home / Self Care | Payer: 59 | Source: Ambulatory Visit | Attending: Obstetrics and Gynecology | Admitting: Obstetrics and Gynecology

## 2012-09-26 DIAGNOSIS — R109 Unspecified abdominal pain: Secondary | ICD-10-CM | POA: Insufficient documentation

## 2012-09-26 DIAGNOSIS — G8918 Other acute postprocedural pain: Secondary | ICD-10-CM

## 2012-09-26 MED ORDER — HYDROMORPHONE HCL PF 1 MG/ML IJ SOLN
1.0000 mg | Freq: Once | INTRAMUSCULAR | Status: AC
  Administered 2012-09-26: 1 mg via INTRAVENOUS
  Filled 2012-09-26: qty 1

## 2012-09-26 MED ORDER — LACTATED RINGERS IV BOLUS (SEPSIS)
500.0000 mL | Freq: Once | INTRAVENOUS | Status: AC
  Administered 2012-09-26: 500 mL via INTRAVENOUS

## 2012-09-26 MED ORDER — HYDROMORPHONE HCL 2 MG PO TABS: 2.0000 mg | ORAL_TABLET | ORAL | Status: DC | PRN

## 2012-09-26 MED ORDER — HYDROMORPHONE HCL PF 1 MG/ML IJ SOLN
2.0000 mg | Freq: Once | INTRAMUSCULAR | Status: AC
  Administered 2012-09-26: 2 mg via INTRAVENOUS
  Filled 2012-09-26: qty 2

## 2012-09-26 MED ORDER — KETOROLAC TROMETHAMINE 30 MG/ML IJ SOLN
30.0000 mg | Freq: Once | INTRAMUSCULAR | Status: AC
  Administered 2012-09-26: 30 mg via INTRAVENOUS
  Filled 2012-09-26: qty 1

## 2012-09-26 MED ORDER — PROMETHAZINE HCL 25 MG/ML IJ SOLN
12.5000 mg | Freq: Once | INTRAMUSCULAR | Status: AC
  Administered 2012-09-26: 12.5 mg via INTRAVENOUS
  Filled 2012-09-26: qty 1

## 2012-09-26 MED ORDER — LACTATED RINGERS IV SOLN
INTRAVENOUS | Status: DC
  Administered 2012-09-26: 15:00:00 via INTRAVENOUS

## 2012-09-26 NOTE — MAU Provider Note (Signed)
  History     CSN: [id number]  Arrival date and time: 09/26/12 1126   First Provider Initiated Contact with Patient 09/26/12 1215      Chief Complaint  Patient presents with  . Abdominal Pain   HPI  thermachoice ablation in the office sent over for pain management  Pertinent Gynecological History: Menses: flow is excessive with use of 6 pads or tampons on heaviest days Bleeding: dysfunctional uterine bleeding Contraception: tubal ligation DES exposure: denies Blood transfusions: none Sexually transmitted diseases: no past history Previous GYN Procedures: thermachoice abltion and essure  Last mammogram: normal Date: na Last pap: normal Date: 2014   Past Medical History  Diagnosis Date  . Allergic rhinitis   . Asthma   . Basal cell carcinoma of forehead   . Basal cell carcinoma of ear     Left  . Benign paroxysmal positional vertigo     Past Surgical History  Procedure Laterality Date  . Cervical cone biopsy    . Melanoma excision      pre melanoma  . Cesarean section  12/00  . Neuroma surgery  6/02    finger  . Basal cell carcinoma excision  9/08    left ear    Family History  Problem Relation Age of Onset  . Asthma Mother   . Heart failure      GM  . Stomach cancer      GF  . Breast cancer Maternal Grandmother 84  . Uterine cancer Maternal Grandmother     History  Substance Use Topics  . Smoking status: Never Smoker   . Smokeless tobacco: Not on file  . Alcohol Use: Yes     Comment: Occasional/ Social    Allergies: No Known Allergies  Prescriptions prior to admission  Medication Sig Dispense Refill  . ALPRAZolam (XANAX) 0.25 MG tablet Take 1 tablet (0.25 mg total) by mouth daily as needed for sleep.  20 tablet  0  . Fluticasone-Salmeterol (ADVAIR) 100-50 MCG/DOSE AEPB Inhale 1 puff into the lungs daily as needed.       Marland Kitchen ibuprofen (ADVIL,MOTRIN) 200 MG tablet Take 800 mg by mouth every 6 (six) hours as needed for pain.      Marland Kitchen albuterol  (PROVENTIL HFA;VENTOLIN HFA) 108 (90 BASE) MCG/ACT inhaler Inhale 2 puffs into the lungs every 4 (four) hours as needed for wheezing.  1 Inhaler  0    ROS Physical Exam   Blood pressure 124/58, pulse 58, temperature 98.2 F (36.8 C), temperature source Oral, resp. rate 20, height 5\' 11"  (1.803 m), weight 86.183 kg (190 lb), last menstrual period 09/03/2012, SpO2 99.00%.  Physical ExamAbdoimin soft mild suprapubic tenderness no peritoneal signs  Post bowel sounds.  MAU Course  Procedures  MDM Post op pain  Assessment and Plan  Status post thermachoice ablation with severe post op pain Pain management  Kamri Gotsch S 09/26/2012, 1:19 PM

## 2012-09-26 NOTE — MAU Note (Signed)
Patient presents to MAu having had an ablation at [facility] office today with severe lower abdominal cramping 10/10 on pain scale. Was sent over with orders.

## 2012-09-26 NOTE — MAU Provider Note (Signed)
History     CSN: [id number]  Arrival date and time: 09/26/12 1126   First Provider Initiated Contact with Patient 09/26/12 1215      Chief Complaint  Patient presents with  . Abdominal Pain   HPI Nichole Arnold is a 47 y.o. G1P1001 who presents to MAU from the office today following a Novasure ablation this morning. The patient states that she has had severe lower abdominal pain since the procedure. She has also had significant N/V. She denies fever and is unsure about bleeding at the time of arrival in MAU.   OB History   Grav Para Term Preterm Abortions TAB SAB Ect Mult Living   1 1 1       1       Past Medical History  Diagnosis Date  . Allergic rhinitis   . Asthma   . Basal cell carcinoma of forehead   . Basal cell carcinoma of ear     Left  . Benign paroxysmal positional vertigo     Past Surgical History  Procedure Laterality Date  . Cervical cone biopsy    . Melanoma excision      pre melanoma  . Cesarean section  12/00  . Neuroma surgery  6/02    finger  . Basal cell carcinoma excision  9/08    left ear    Family History  Problem Relation Age of Onset  . Asthma Mother   . Heart failure      GM  . Stomach cancer      GF  . Breast cancer Maternal Grandmother 62  . Uterine cancer Maternal Grandmother     History  Substance Use Topics  . Smoking status: Never Smoker   . Smokeless tobacco: Not on file  . Alcohol Use: Yes     Comment: Occasional/ Social    Allergies: No Known Allergies  No prescriptions prior to admission    Review of Systems  Constitutional: Positive for fever.  Gastrointestinal: Positive for nausea, vomiting and abdominal pain.   Physical Exam   Blood pressure 113/62, pulse 61, temperature 98.2 F (36.8 C), temperature source Oral, resp. rate 18, height 5\' 11"  (1.803 m), weight 190 lb (86.183 kg), last menstrual period 09/03/2012, SpO2 100.00%.  Physical Exam  Constitutional: She is oriented to person, place,  and time. She appears well-nourished. She appears distressed.  Cardiovascular: Normal rate, regular rhythm and normal heart sounds.   Respiratory: Effort normal and breath sounds normal. No respiratory distress.  GI: Soft. Bowel sounds are normal. She exhibits no distension and no mass. There is no tenderness. There is no rebound and no guarding.  Neurological: She is alert and oriented to person, place, and time.  Skin: Skin is warm and dry. No erythema.  Psychiatric: She has a normal mood and affect.  The patient is unable to lay still during waves of pain and then appears asleep in between painful episodes     MAU Course  Procedures None  MDM Discussed patient with Dr. Langston Masker. Give 30 mg IM Toradol.  Dr. Langston Masker contacted Dr. Arelia Sneddon who performed the procedure. He came to MAU to evaluate the patient. He would like to re-evaluate in 1 hour after Toradol Discussed patient with Dr. Arelia Sneddon. Patient still rates pain at 9-10/10 when it comes and is sleepy in between waves of pain. Husband states that patient has not been able to lay still x 20 minutes prior to re-evaluation Dr. Arelia Sneddon would like to try 1 addition  mg of Dilaudid IV and re-evaluate in 1 hour ~ 30 minutes after last dose of Dilaudid, patient reports pain is now 3/10 and she wants to go home and rest Discussed patient's progress with Dr. Arelia Sneddon. Discharge patient home with Rx for PO Dilaudid. Office will call her Monday with follow-up appointment information  Assessment and Plan  A: Post-operative Pelvic pain  P: Discharge home Rx for Dilaudid given to patient Patient instructed to use Dilaudid instead of previously prescribed Percocet Patient advised that she will be contacted with follow-up information Patient may return to MAU as needed or if her condition were to change or worsen   Freddi Starr, PA-C  09/26/2012, 7:51 PM

## 2012-11-25 ENCOUNTER — Other Ambulatory Visit: Payer: 59

## 2012-12-09 ENCOUNTER — Encounter: Payer: 59 | Admitting: Family Medicine

## 2013-02-02 ENCOUNTER — Ambulatory Visit (INDEPENDENT_AMBULATORY_CARE_PROVIDER_SITE_OTHER): Payer: 59 | Admitting: Family Medicine

## 2013-02-02 ENCOUNTER — Encounter: Payer: Self-pay | Admitting: Family Medicine

## 2013-02-02 VITALS — BP 136/84 | HR 66 | Temp 98.8°F | Ht 70.0 in | Wt 195.5 lb

## 2013-02-02 DIAGNOSIS — J069 Acute upper respiratory infection, unspecified: Secondary | ICD-10-CM

## 2013-02-02 DIAGNOSIS — B9789 Other viral agents as the cause of diseases classified elsewhere: Principal | ICD-10-CM

## 2013-02-02 MED ORDER — HYDROCOD POLST-CHLORPHEN POLST 10-8 MG/5ML PO LQCR: 5.0000 mL | Freq: Two times a day (BID) | ORAL | Status: DC | PRN

## 2013-02-02 NOTE — Progress Notes (Signed)
Subjective:    Patient ID: Nichole Arnold, female    DOB: 14-Apr-1965, 48 y.o.   MRN: [id number]  HPI Here with uri symptoms  Started last wed with scratchy throat - so she inc fluids/ took airbourne/ got rest  Got worse  Bad st fri/ sat A little low grade fever for several days  Then coughing started -cannot stop / much worse at night   Not much phlegm - a little color  Throat is improved  Not a lot of nasal congestion   Taking nyquil/ dayquil and mucinex   No wheezing  Has not needed albuterol Does take advair  Patient Active Problem List   Diagnosis Date Noted  . Family history of stomach cancer 08/11/2012  . Anxiety disorder 08/11/2012  . Routine general medical examination at a health care facility 08/05/2012  . Unspecified family circumstance 11/09/2010  . ALLERGIC RHINITIS 08/05/2006  . ASTHMA 08/05/2006  . SKIN CANCER, HX OF 08/05/2006   Past Medical History  Diagnosis Date  . Allergic rhinitis   . Asthma   . Basal cell carcinoma of forehead   . Basal cell carcinoma of ear     Left  . Benign paroxysmal positional vertigo    Past Surgical History  Procedure Laterality Date  . Cervical cone biopsy    . Melanoma excision      pre melanoma  . Cesarean section  12/00  . Neuroma surgery  6/02    finger  . Basal cell carcinoma excision  9/08    left ear   History  Substance Use Topics  . Smoking status: Never Smoker   . Smokeless tobacco: Not on file  . Alcohol Use: Yes     Comment: Occasional/ Social   Family History  Problem Relation Age of Onset  . Asthma Mother   . Heart failure      GM  . Stomach cancer      GF  . Breast cancer Maternal Grandmother 54  . Uterine cancer Maternal Grandmother    No Known Allergies Current Outpatient Prescriptions on File Prior to Visit  Medication Sig Dispense Refill  . Fluticasone-Salmeterol (ADVAIR) 100-50 MCG/DOSE AEPB Inhale 1 puff into the lungs daily as needed.       Marland Kitchen ibuprofen (ADVIL,MOTRIN)  200 MG tablet Take 800 mg by mouth every 6 (six) hours as needed for pain.      Marland Kitchen albuterol (PROVENTIL HFA;VENTOLIN HFA) 108 (90 BASE) MCG/ACT inhaler Inhale 2 puffs into the lungs every 4 (four) hours as needed for wheezing.  1 Inhaler  0  . ALPRAZolam (XANAX) 0.25 MG tablet Take 1 tablet (0.25 mg total) by mouth daily as needed for sleep.  20 tablet  0  . HYDROmorphone (DILAUDID) 2 MG tablet Take 1 tablet (2 mg total) by mouth every 4 (four) hours as needed for pain.  30 tablet  0   No current facility-administered medications on file prior to visit.      Review of Systems    Review of Systems  Constitutional: Negative for fever, appetite change,  and unexpected weight change.  ENT pos for post nasal drip /neg for sinus pain  Eyes: Negative for pain and visual disturbance.  Respiratory: Negative for wheeze  and shortness of breath.   Cardiovascular: Negative for cp or palpitations    Gastrointestinal: Negative for nausea, diarrhea and constipation.  Genitourinary: Negative for urgency and frequency.  Skin: Negative for pallor or rash   Neurological: Negative for weakness,  light-headedness, numbness and headaches.  Hematological: Negative for adenopathy. Does not bruise/bleed easily.  Psychiatric/Behavioral: Negative for dysphoric mood. The patient is not nervous/anxious.      Objective:   Physical Exam  Constitutional: She appears well-developed and well-nourished. No distress.  HENT:  Head: Normocephalic and atraumatic.  Right Ear: External ear normal.  Left Ear: External ear normal.  Mouth/Throat: Oropharynx is clear and moist. No oropharyngeal exudate.  Boggy nares No sinus tenderness  Eyes: Conjunctivae and EOM are normal. Pupils are equal, round, and reactive to light. Right eye exhibits no discharge. Left eye exhibits no discharge.  Neck: Normal range of motion. Neck supple.  Cardiovascular: Normal rate and regular rhythm.   Pulmonary/Chest: Effort normal and breath  sounds normal. No respiratory distress. She has no wheezes. She has no rales. She exhibits no tenderness.  Harsh hacking persistent cough  Lymphadenopathy:    She has no cervical adenopathy.  Skin: Skin is warm and dry. No rash noted.  Psychiatric: She has a normal mood and affect.          Assessment & Plan:

## 2013-02-02 NOTE — Patient Instructions (Signed)
Take care of yourself - drink fluids and rest  Use tussionex with caution - for sedation  Update if not starting to improve in a week or if worsening

## 2013-02-02 NOTE — Progress Notes (Signed)
Pre-visit discussion using our clinic review tool. No additional management support is needed unless otherwise documented below in the visit note.  

## 2013-02-02 NOTE — Assessment & Plan Note (Signed)
Reassuring exam  Disc symptomatic care - see instructions on AVS  tussionex px given- to use with caution  Update if not starting to improve in a week or if worsening

## 2013-02-17 ENCOUNTER — Other Ambulatory Visit: Payer: Self-pay | Admitting: Family Medicine

## 2013-04-30 ENCOUNTER — Other Ambulatory Visit: Payer: Self-pay | Admitting: Dermatology

## 2013-05-25 ENCOUNTER — Ambulatory Visit (INDEPENDENT_AMBULATORY_CARE_PROVIDER_SITE_OTHER): Payer: 59 | Admitting: Physician Assistant

## 2013-05-25 VITALS — BP 122/66 | HR 68 | Temp 98.3°F | Resp 16 | Ht 69.75 in | Wt 201.8 lb

## 2013-05-25 DIAGNOSIS — Z23 Encounter for immunization: Secondary | ICD-10-CM

## 2013-05-25 DIAGNOSIS — S61209A Unspecified open wound of unspecified finger without damage to nail, initial encounter: Secondary | ICD-10-CM

## 2013-05-25 DIAGNOSIS — M79609 Pain in unspecified limb: Secondary | ICD-10-CM

## 2013-05-25 DIAGNOSIS — M79645 Pain in left finger(s): Secondary | ICD-10-CM

## 2013-05-25 DIAGNOSIS — S61219A Laceration without foreign body of unspecified finger without damage to nail, initial encounter: Secondary | ICD-10-CM

## 2013-05-25 NOTE — Patient Instructions (Signed)

## 2013-05-25 NOTE — Progress Notes (Signed)
   Subjective:    Patient ID: Nichole Arnold, female    DOB: 1965/04/17, 48 y.o.   MRN: [id number]  HPI   [name] is a very pleasant 49 yr old female here with laceration to LEFT index finger.  Laceration occurred last night (about 18 hours ago.)  She was using scissors to clean between the boards of her deck.  Scissors slipped and lacerated the LEFT index finger.  Pt reports she cleaned the laceration well - a friend helped dress the finger, applied bacitracin.  Today the finger continues to ooze and is quite tender.  She reports sensation, ROM, and strength are all normal.  Last tetanus 2008   Review of Systems  Constitutional: Negative for fever and chills.  Respiratory: Negative.   Cardiovascular: Negative.   Musculoskeletal: Positive for arthralgias.  Skin: Positive for wound.  Neurological: Negative for weakness and numbness.       Objective:   Physical Exam  Vitals reviewed. Constitutional: She is oriented to person, place, and time. She appears well-developed and well-nourished. No distress.  HENT:  Head: Normocephalic and atraumatic.  Eyes: Conjunctivae are normal. No scleral icterus.  Cardiovascular: Intact distal pulses.   Pulmonary/Chest: Effort normal.  Musculoskeletal:       Left hand: She exhibits tenderness and laceration. She exhibits normal range of motion and normal capillary refill. Normal sensation noted. Normal strength noted.       Hands: Distal flap laceration at distal phalanx; sensation and strength intact; cap refill normal; pulses normal  Neurological: She is alert and oriented to person, place, and time.  Skin: Skin is warm and dry.  Psychiatric: She has a normal mood and affect. Her behavior is normal.     Procedure Note: Verbal consent obtained from the patient.  Metacarpal block with 2 cc Lidocaine 2% without epinephrine.  Wound scrubbed with soap and water.  Wound explored.  No foreign bodies or deep structure injury noted.  Wound closed  with #4 simple interrupted sutures of 5-0 ethilon.  Area cleansed and dressed.  Pt tolerated very well     Assessment & Plan:  Finger laceration  Finger pain, left  Need for Tdap vaccination - Plan: Tdap vaccine greater than or equal to 7yo IM   [name] is a very pleasant 48 yr old female here with distal flap laceration to LEFT index finger.  Occurred 18 hours ago, but would benefit from closure given location and depth.  Wound repaired as above.  Discussed wound care.  Anticipate suture removal in 10 days.  Discussed signs of infection, RTC precautions.  Tdap updated.   Alonza Smoker MHS, PA-C [facility] 5/18/20153:23 PM

## 2013-06-04 ENCOUNTER — Encounter: Payer: Self-pay | Admitting: Physician Assistant

## 2013-06-04 ENCOUNTER — Ambulatory Visit (INDEPENDENT_AMBULATORY_CARE_PROVIDER_SITE_OTHER): Payer: 59 | Admitting: Physician Assistant

## 2013-06-04 VITALS — BP 102/70 | HR 56 | Temp 98.0°F | Resp 16 | Ht 70.0 in | Wt 195.0 lb

## 2013-06-04 DIAGNOSIS — Z4802 Encounter for removal of sutures: Secondary | ICD-10-CM

## 2013-06-04 NOTE — Progress Notes (Signed)
   Patient ID: Nichole Arnold MRN: [id number], DOB: 26-Sep-1965 48 y.o. Date of Encounter: 06/04/2013, 12:01 PM  Primary Physician: Loura Pardon, MD  Chief Complaint: Suture removal    See note from 05/25/13  HPI: 48 y.o. female with injury to left 2nd digit Here for suture removal s/p placement on 05/25/13 Doing well No issues/complaints Afebrile/ No chills No erythema Minimal pain Able to move without difficulty Normal sensation  Past Medical History  Diagnosis Date  . Allergic rhinitis   . Asthma   . Basal cell carcinoma of forehead   . Basal cell carcinoma of ear     Left  . Benign paroxysmal positional vertigo      Home Meds: Prior to Admission medications   Medication Sig Start Date End Date Taking? Authorizing Provider  ADVAIR DISKUS 100-50 MCG/DOSE AEPB INHALE ONE (1) PUFF EVERY 12 HOURS AS DIRECTED. RINSE MOUTH AFTER EACH USE.    Abner Greenspan, MD  albuterol (PROVENTIL HFA;VENTOLIN HFA) 108 (90 BASE) MCG/ACT inhaler Inhale 2 puffs into the lungs every 4 (four) hours as needed for wheezing. 01/28/12   Abner Greenspan, MD  ALPRAZolam Duanne Moron) 0.25 MG tablet Take 1 tablet (0.25 mg total) by mouth daily as needed for sleep. 08/11/12   Abner Greenspan, MD  cetirizine (ZYRTEC) 10 MG tablet Take 10 mg by mouth daily.    Historical Provider, MD  chlorpheniramine-HYDROcodone (TUSSIONEX PENNKINETIC ER) 10-8 MG/5ML LQCR Take 5 mLs by mouth every 12 (twelve) hours as needed for cough. 02/02/13   Abner Greenspan, MD  ibuprofen (ADVIL,MOTRIN) 200 MG tablet Take 800 mg by mouth every 6 (six) hours as needed for pain.    Historical Provider, MD    Allergies: No Known Allergies  Physical Exam: Blood pressure 102/70, pulse 56, temperature 98 F (36.7 C), temperature source Oral, resp. rate 16, height 5\' 10"  (1.778 m), weight 195 lb (88.451 kg), SpO2 100.00%., Body mass index is 27.98 kg/(m^2). General: Well developed, well nourished, in no acute distress. Head: Normocephalic, atraumatic,  sclera non-icteric, no xanthomas, nares are without discharge.  Neck: Supple. Lungs: Breathing is unlabored. Heart: Normal rate. Msk:  Strength and tone appear normal for age. Wound: Wound well healed without erythema, swelling, or tenderness to palpation. FROM and 5/5 strength with normal sensation throughout. Skin: See above, otherwise dry without rash or erythema. Extremities: No clubbing or cyanosis. No edema. Neuro: Alert and oriented X 3. Moves all extremities spontaneously.  Psych:  Responds to questions appropriately with a normal affect.   PROCEDURE: Verbal consent obtained. 4 sutures removed without difficulty.  Assessment and Plan: 48 y.o. female here for suture removal for wound described above. -Sutures removed per above -Wound resolved -RTC prn  Signed, Christell Faith, MHS, PA-C [facility], [place] [id number] [facility] 06/04/2013 12:01 PM

## 2013-07-02 ENCOUNTER — Other Ambulatory Visit: Payer: Self-pay | Admitting: Dermatology

## 2013-09-03 ENCOUNTER — Other Ambulatory Visit: Payer: Self-pay | Admitting: Obstetrics and Gynecology

## 2013-09-03 DIAGNOSIS — R928 Other abnormal and inconclusive findings on diagnostic imaging of breast: Secondary | ICD-10-CM

## 2013-09-09 ENCOUNTER — Encounter (INDEPENDENT_AMBULATORY_CARE_PROVIDER_SITE_OTHER): Payer: Self-pay

## 2013-09-09 ENCOUNTER — Ambulatory Visit
Admission: RE | Admit: 2013-09-09 | Discharge: 2013-09-09 | Disposition: A | Discharge status: Home / Self Care | Payer: 59 | Source: Ambulatory Visit | Attending: Obstetrics and Gynecology | Admitting: Obstetrics and Gynecology

## 2013-09-09 DIAGNOSIS — R928 Other abnormal and inconclusive findings on diagnostic imaging of breast: Secondary | ICD-10-CM

## 2013-09-25 ENCOUNTER — Other Ambulatory Visit: Payer: Self-pay | Admitting: Obstetrics and Gynecology

## 2013-09-25 DIAGNOSIS — N6489 Other specified disorders of breast: Secondary | ICD-10-CM

## 2013-09-25 DIAGNOSIS — R928 Other abnormal and inconclusive findings on diagnostic imaging of breast: Secondary | ICD-10-CM

## 2013-10-09 ENCOUNTER — Ambulatory Visit
Admission: RE | Admit: 2013-10-09 | Discharge: 2013-10-09 | Disposition: A | Discharge status: Home / Self Care | Payer: 59 | Source: Ambulatory Visit | Attending: Obstetrics and Gynecology | Admitting: Obstetrics and Gynecology

## 2013-10-09 DIAGNOSIS — R928 Other abnormal and inconclusive findings on diagnostic imaging of breast: Secondary | ICD-10-CM

## 2013-10-09 MED ORDER — GADOBENATE DIMEGLUMINE 529 MG/ML IV SOLN
18.0000 mL | Freq: Once | INTRAVENOUS | Status: AC | PRN
  Administered 2013-10-09: 18 mL via INTRAVENOUS

## 2013-11-09 ENCOUNTER — Encounter: Payer: Self-pay | Admitting: Physician Assistant

## 2014-02-01 ENCOUNTER — Encounter: Payer: Self-pay | Admitting: Family Medicine

## 2014-02-01 ENCOUNTER — Ambulatory Visit (INDEPENDENT_AMBULATORY_CARE_PROVIDER_SITE_OTHER): Payer: 59 | Admitting: Family Medicine

## 2014-02-01 VITALS — BP 98/56 | HR 60 | Temp 98.3°F | Wt 184.5 lb

## 2014-02-01 DIAGNOSIS — R42 Dizziness and giddiness: Secondary | ICD-10-CM | POA: Insufficient documentation

## 2014-02-01 MED ORDER — MECLIZINE HCL 25 MG PO TABS: 12.5000 mg | ORAL_TABLET | Freq: Three times a day (TID) | ORAL | Status: DC | PRN

## 2014-02-01 NOTE — Progress Notes (Signed)
Pre visit review using our clinic review tool, if applicable. No additional management support is needed unless otherwise documented below in the visit note.  Woke up yesterday fine but then the room started spinning, as she rolled over in bed. Nausea, dry heaving as the day went on.  Was finally able to sleep last night.  Then had return of sx when rolling over in bed.  Better sitting up, not resolved, but better.  Hasn't tried OTC meds yet.  No ear ringing. She has some ear popping.  No syncope, no presyncope.  The room spinning is always in the same direction.  She has a driver today.    Meds, vitals, and allergies reviewed.   ROS: See HPI.  Otherwise, noncontributory.  GEN: nad, alert and oriented HEENT: mucous membranes moist, TM wnl, nasal and OP exam wnl, PERRL, EOMI NECK: supple w/o LA CV: rrr.  PULM: ctab, no inc wob ABD: soft, +bs Declined/deferred DHP since she already has vertigo sx during head movement (but not with eye tracking) in exam chair Speech wnl, gait wnl.

## 2014-02-01 NOTE — Patient Instructions (Signed)
Try OTC meclizine for the dizziness and when better try the bedside exercise.  Take care. Glad to see you.  Don't drive until you feel better.

## 2014-02-02 NOTE — Assessment & Plan Note (Signed)
Likely BPV, can use OTC meclizine and then bedside exercise, d/w pt.  She agrees. F/u prn.  Has a driver today.  Routine cautions given.

## 2014-02-24 ENCOUNTER — Telehealth: Payer: Self-pay | Admitting: Family Medicine

## 2014-02-24 DIAGNOSIS — Z0000 Encounter for general adult medical examination without abnormal findings: Secondary | ICD-10-CM

## 2014-02-24 NOTE — Telephone Encounter (Signed)
-----   Message from Ellamae Sia sent at 02/18/2014  4:18 PM EST ----- Regarding: Lab orders for Thursday,2.18.16 Patient is scheduled for CPX labs, please order future labs, Thanks , Karna Christmas

## 2014-02-25 ENCOUNTER — Other Ambulatory Visit (INDEPENDENT_AMBULATORY_CARE_PROVIDER_SITE_OTHER): Payer: 59

## 2014-02-25 DIAGNOSIS — Z0000 Encounter for general adult medical examination without abnormal findings: Secondary | ICD-10-CM

## 2014-02-25 LAB — LIPID PANEL
Cholesterol: 196 mg/dL (ref 0–200)
HDL: 75.2 mg/dL (ref >39.00)
LDL Cholesterol: 111 mg/dL — ABNORMAL HIGH (ref 0–99)
NONHDL: 120.8
Total CHOL/HDL Ratio: 3
Triglycerides: 50 mg/dL (ref 0.0–149.0)
VLDL: 10 mg/dL (ref 0.0–40.0)

## 2014-02-25 LAB — CBC WITH DIFFERENTIAL/PLATELET
BASOS ABS: 0 10*3/uL (ref 0.0–0.1)
Basophils Relative: 0.4 % (ref 0.0–3.0)
EOS ABS: 0.1 10*3/uL (ref 0.0–0.7)
Eosinophils Relative: 0.9 % (ref 0.0–5.0)
HEMATOCRIT: 38.9 % (ref 36.0–46.0)
HEMOGLOBIN: 13 g/dL (ref 12.0–15.0)
LYMPHS ABS: 1.5 10*3/uL (ref 0.7–4.0)
Lymphocytes Relative: 24.4 % (ref 12.0–46.0)
MCHC: 33.3 g/dL (ref 30.0–36.0)
MCV: 93.8 fl (ref 78.0–100.0)
MONO ABS: 0.5 10*3/uL (ref 0.1–1.0)
Monocytes Relative: 8.6 % (ref 3.0–12.0)
NEUTROS ABS: 4 10*3/uL (ref 1.4–7.7)
Neutrophils Relative %: 65.7 % (ref 43.0–77.0)
PLATELETS: 316 10*3/uL (ref 150.0–400.0)
RBC: 4.15 Mil/uL (ref 3.87–5.11)
RDW: 12.8 % (ref 11.5–15.5)
WBC: 6 10*3/uL (ref 4.0–10.5)

## 2014-02-25 LAB — COMPREHENSIVE METABOLIC PANEL
ALT: 16 U/L (ref 0–35)
AST: 20 U/L (ref 0–37)
Albumin: 4 g/dL (ref 3.5–5.2)
Alkaline Phosphatase: 44 U/L (ref 39–117)
BILIRUBIN TOTAL: 0.4 mg/dL (ref 0.2–1.2)
BUN: 16 mg/dL (ref 6–23)
CO2: 29 meq/L (ref 19–32)
CREATININE: 0.89 mg/dL (ref 0.40–1.20)
Calcium: 9.2 mg/dL (ref 8.4–10.5)
Chloride: 105 mEq/L (ref 96–112)
GFR: 71.97 mL/min (ref >60.00)
Glucose, Bld: 89 mg/dL (ref 70–99)
Potassium: 4.2 mEq/L (ref 3.5–5.1)
SODIUM: 137 meq/L (ref 135–145)
Total Protein: 6.7 g/dL (ref 6.0–8.3)

## 2014-02-25 LAB — TSH: TSH: 2.41 u[IU]/mL (ref 0.35–4.50)

## 2014-03-02 ENCOUNTER — Encounter: Payer: Self-pay | Admitting: Family Medicine

## 2014-03-02 ENCOUNTER — Ambulatory Visit (INDEPENDENT_AMBULATORY_CARE_PROVIDER_SITE_OTHER): Payer: 59 | Admitting: Family Medicine

## 2014-03-02 VITALS — BP 98/64 | HR 56 | Temp 98.3°F | Ht 70.0 in | Wt 187.8 lb

## 2014-03-02 DIAGNOSIS — J4520 Mild intermittent asthma, uncomplicated: Secondary | ICD-10-CM

## 2014-03-02 DIAGNOSIS — Z23 Encounter for immunization: Secondary | ICD-10-CM

## 2014-03-02 DIAGNOSIS — Z0000 Encounter for general adult medical examination without abnormal findings: Secondary | ICD-10-CM

## 2014-03-02 MED ORDER — FLUTICASONE-SALMETEROL 100-50 MCG/DOSE IN AEPB: INHALATION_SPRAY | RESPIRATORY_TRACT | Status: DC

## 2014-03-02 NOTE — Progress Notes (Signed)
Subjective:    Patient ID: Nichole Arnold, female    DOB: September 08, 1965, 49 y.o.   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   Gyn care - gyn / Dr Radene Knee- last in aug- nl pap  Hx of cervical conization in the past   Flu shot - will get today   Mammogram 9/15 - had bilat breast mri due to distortion of R breast  Recommended 6 mo f/u if nl  Will notify her    Td 5/15  Asthma- doing pretty well/ no problems (was a bit worse with stress- but improved now)   Results for orders placed or performed in visit on 02/25/14  CBC with Differential/Platelet  Result Value Ref Range   WBC 6.0 4.0 - 10.5 K/uL   RBC 4.15 3.87 - 5.11 Mil/uL   Hemoglobin 13.0 12.0 - 15.0 g/dL   HCT 38.9 36.0 - 46.0 %   MCV 93.8 78.0 - 100.0 fl   MCHC 33.3 30.0 - 36.0 g/dL   RDW 12.8 11.5 - 15.5 %   Platelets 316.0 150.0 - 400.0 K/uL   Neutrophils Relative % 65.7 43.0 - 77.0 %   Lymphocytes Relative 24.4 12.0 - 46.0 %   Monocytes Relative 8.6 3.0 - 12.0 %   Eosinophils Relative 0.9 0.0 - 5.0 %   Basophils Relative 0.4 0.0 - 3.0 %   Neutro Abs 4.0 1.4 - 7.7 K/uL   Lymphs Abs 1.5 0.7 - 4.0 K/uL   Monocytes Absolute 0.5 0.1 - 1.0 K/uL   Eosinophils Absolute 0.1 0.0 - 0.7 K/uL   Basophils Absolute 0.0 0.0 - 0.1 K/uL  Comprehensive metabolic panel  Result Value Ref Range   Sodium 137 135 - 145 mEq/L   Potassium 4.2 3.5 - 5.1 mEq/L   Chloride 105 96 - 112 mEq/L   CO2 29 19 - 32 mEq/L   Glucose, Bld 89 70 - 99 mg/dL   BUN 16 6 - 23 mg/dL   Creatinine, Ser 0.89 0.40 - 1.20 mg/dL   Total Bilirubin 0.4 0.2 -  1.2 mg/dL   Alkaline Phosphatase 44 39 - 117 U/L   AST 20 0 - 37 U/L   ALT 16 0 - 35 U/L   Total Protein 6.7 6.0 - 8.3 g/dL   Albumin 4.0 3.5 - 5.2 g/dL   Calcium 9.2 8.4 - 10.5 mg/dL   GFR 71.97 >60.00 mL/min  Lipid panel  Result Value Ref Range   Cholesterol 196 0 - 200 mg/dL   Triglycerides 50.0 0.0 - 149.0 mg/dL   HDL 75.20 >39.00 mg/dL   VLDL 10.0 0.0 - 40.0 mg/dL   LDL Cholesterol 111 (H) 0 - 99 mg/dL   Total CHOL/HDL Ratio 3    NonHDL 120.80   TSH  Result Value Ref Range   TSH 2.41 0.35 - 4.50 uIU/mL    Labs look good   Patient Active Problem List   Diagnosis Date Noted  . Vertigo 02/01/2014  .  Family history of stomach cancer 08/11/2012  . Anxiety disorder 08/11/2012  . Routine general medical examination at a health care facility 08/05/2012  . Unspecified family circumstance 11/09/2010  . ALLERGIC RHINITIS 08/05/2006  . ASTHMA 08/05/2006  . SKIN CANCER, HX OF 08/05/2006   Past Medical History  Diagnosis Date  . Allergic rhinitis   . Asthma   . Basal cell carcinoma of forehead   . Basal cell carcinoma of ear     Left  . Benign paroxysmal positional vertigo    Past Surgical History  Procedure Laterality Date  . Cervical cone biopsy    . Melanoma excision      pre melanoma  . Cesarean section  12/00  . Neuroma surgery  6/02    finger  . Basal cell carcinoma excision  9/08    left ear   History  Substance Use Topics  . Smoking status: Never Smoker   . Smokeless tobacco: Not on file  . Alcohol Use: Yes     Comment: Occasional/ Social   Family History  Problem Relation Age of Onset  . Asthma Mother   . Heart failure      GM  . Stomach cancer      GF  . Breast cancer Maternal Grandmother 65  . Uterine cancer Maternal Grandmother    No Known Allergies Current Outpatient Prescriptions on File Prior to Visit  Medication Sig Dispense Refill  . ADVAIR DISKUS 100-50 MCG/DOSE AEPB INHALE ONE (1) PUFF EVERY 12 HOURS AS DIRECTED. RINSE MOUTH AFTER  EACH USE. 60 each 5  . albuterol (PROVENTIL HFA;VENTOLIN HFA) 108 (90 BASE) MCG/ACT inhaler Inhale 2 puffs into the lungs every 4 (four) hours as needed for wheezing. 1 Inhaler 0  . ALPRAZolam (XANAX) 0.25 MG tablet Take 1 tablet (0.25 mg total) by mouth daily as needed for sleep. 20 tablet 0  . cetirizine (ZYRTEC) 10 MG tablet Take 10 mg by mouth daily as needed.     Marland Kitchen ibuprofen (ADVIL,MOTRIN) 200 MG tablet Take 800 mg by mouth every 6 (six) hours as needed for pain.    . meclizine (ANTIVERT) 25 MG tablet Take 0.5-1 tablets (12.5-25 mg total) by mouth 3 (three) times daily as needed for dizziness.     No current facility-administered medications on file prior to visit.     Review of Systems Review of Systems  Constitutional: Negative for fever, appetite change, fatigue and unexpected weight change.  Eyes: Negative for pain and visual disturbance.  Respiratory: Negative for cough and shortness of breath.   Cardiovascular: Negative for cp or palpitations    Gastrointestinal: Negative for nausea, diarrhea and constipation.  Genitourinary: Negative for urgency and frequency.  Skin: Negative for pallor or rash   Neurological: Negative for weakness, light-headedness, numbness and headaches.  Hematological: Negative for adenopathy. Does not bruise/bleed easily.  Psychiatric/Behavioral: Negative for dysphoric mood. The patient is not nervous/anxious.  pos for stressors/ marital primarily        Objective:   Physical Exam  Constitutional: She appears well-developed and well-nourished. No distress.  HENT:  Head: Normocephalic and atraumatic.  Right Ear: External ear normal.  Left Ear: External ear normal.  Nose: Nose normal.  Mouth/Throat: Oropharynx is clear and moist.  Eyes: Conjunctivae and EOM are normal. Pupils are equal, round, and reactive to light. Right eye exhibits no discharge. Left eye exhibits no discharge. No scleral icterus.  Neck: Normal range of motion. Neck supple. No JVD  present. No thyromegaly present.  Cardiovascular:  Normal rate, regular rhythm, normal heart sounds and intact distal pulses.  Exam reveals no gallop.   Pulmonary/Chest: Effort normal and breath sounds normal. No respiratory distress. She has no wheezes. She has no rales.  Abdominal: Soft. Bowel sounds are normal. She exhibits no distension and no mass. There is no tenderness.  Musculoskeletal: She exhibits no edema or tenderness.  Lymphadenopathy:    She has no cervical adenopathy.  Neurological: She is alert. She has normal reflexes. No cranial nerve deficit. She exhibits normal muscle tone. Coordination normal.  Skin: Skin is warm and dry. No rash noted. No erythema. No pallor.  Solar lentigos diffusely Several erythematous areas on R leg overlying varicose spider veins    Psychiatric: She has a normal mood and affect. Her mood appears not anxious. She does not exhibit a depressed mood.          Assessment & Plan:   Problem List Items Addressed This Visit      Respiratory   Asthma    Doing well  Flu shot today  Refilled advair  Has not needed rescue inhaler at all       Relevant Medications   Fluticasone-Salmeterol (ADVAIR DISKUS) 100-50 MCG/DOSE AEPB     Other   Routine general medical examination at a health care facility - Primary    Reviewed health habits including diet and exercise and skin cancer prevention Reviewed appropriate screening tests for age  Also reviewed health mt list, fam hx and immunization status , as well as social and family history   Labs reviewed Declined HIV screen -since she gets screened when donating blood  Flu shot today  Enc healthy habits  Regular derm visits for hx of basal cell skin cancer

## 2014-03-02 NOTE — Progress Notes (Signed)
Pre visit review using our clinic review tool, if applicable. No additional management support is needed unless otherwise documented below in the visit note. 

## 2014-03-02 NOTE — Assessment & Plan Note (Signed)
Reviewed health habits including diet and exercise and skin cancer prevention Reviewed appropriate screening tests for age  Also reviewed health mt list, fam hx and immunization status , as well as social and family history   Labs reviewed Declined HIV screen -since she gets screened when donating blood  Flu shot today  Enc healthy habits  Regular derm visits for hx of basal cell skin cancer

## 2014-03-02 NOTE — Assessment & Plan Note (Signed)
Doing well  Flu shot today  Refilled advair  Has not needed rescue inhaler at all

## 2014-03-02 NOTE — Patient Instructions (Signed)
Labs look good Continue healthy habits and exercise  Continue dermatology follow ups  Flu shot today  I think you will be due for breast re imaging around April --let us or gyn know if you do not get a reminder

## 2014-03-17 ENCOUNTER — Other Ambulatory Visit: Payer: Self-pay | Admitting: Obstetrics and Gynecology

## 2014-03-17 DIAGNOSIS — N6489 Other specified disorders of breast: Secondary | ICD-10-CM

## 2014-03-25 ENCOUNTER — Encounter (INDEPENDENT_AMBULATORY_CARE_PROVIDER_SITE_OTHER): Payer: Self-pay

## 2014-03-25 ENCOUNTER — Ambulatory Visit
Admission: RE | Admit: 2014-03-25 | Discharge: 2014-03-25 | Disposition: A | Discharge status: Home / Self Care | Payer: 59 | Source: Ambulatory Visit | Attending: Obstetrics and Gynecology | Admitting: Obstetrics and Gynecology

## 2014-03-25 DIAGNOSIS — N6489 Other specified disorders of breast: Secondary | ICD-10-CM

## 2014-10-07 ENCOUNTER — Other Ambulatory Visit: Payer: Self-pay | Admitting: Obstetrics and Gynecology

## 2014-10-07 DIAGNOSIS — N6489 Other specified disorders of breast: Secondary | ICD-10-CM

## 2014-10-13 ENCOUNTER — Other Ambulatory Visit: Payer: Self-pay | Admitting: Obstetrics and Gynecology

## 2014-10-13 ENCOUNTER — Ambulatory Visit
Admission: RE | Admit: 2014-10-13 | Discharge: 2014-10-13 | Disposition: A | Discharge status: Home / Self Care | Payer: Commercial Managed Care - HMO | Source: Ambulatory Visit | Attending: Obstetrics and Gynecology | Admitting: Obstetrics and Gynecology

## 2014-10-13 DIAGNOSIS — N6489 Other specified disorders of breast: Secondary | ICD-10-CM

## 2014-10-15 ENCOUNTER — Other Ambulatory Visit: Payer: Self-pay | Admitting: Obstetrics and Gynecology

## 2014-10-15 DIAGNOSIS — N6489 Other specified disorders of breast: Secondary | ICD-10-CM

## 2014-10-18 ENCOUNTER — Ambulatory Visit
Admission: RE | Admit: 2014-10-18 | Discharge: 2014-10-18 | Disposition: A | Discharge status: Home / Self Care | Payer: Commercial Managed Care - HMO | Source: Ambulatory Visit | Attending: Obstetrics and Gynecology | Admitting: Obstetrics and Gynecology

## 2014-10-18 DIAGNOSIS — N6489 Other specified disorders of breast: Secondary | ICD-10-CM

## 2014-11-05 ENCOUNTER — Other Ambulatory Visit: Payer: Self-pay | Admitting: General Surgery

## 2014-11-05 DIAGNOSIS — N6310 Unspecified lump in the right breast, unspecified quadrant: Secondary | ICD-10-CM

## 2014-11-29 ENCOUNTER — Encounter (HOSPITAL_BASED_OUTPATIENT_CLINIC_OR_DEPARTMENT_OTHER): Payer: Self-pay | Admitting: *Deleted

## 2014-12-01 ENCOUNTER — Encounter (HOSPITAL_BASED_OUTPATIENT_CLINIC_OR_DEPARTMENT_OTHER)
Admission: RE | Admit: 2014-12-01 | Discharge: 2014-12-01 | Disposition: A | Discharge status: Home / Self Care | Payer: Commercial Managed Care - HMO | Source: Ambulatory Visit | Attending: General Surgery | Admitting: General Surgery

## 2014-12-01 DIAGNOSIS — N63 Unspecified lump in breast: Secondary | ICD-10-CM | POA: Diagnosis present

## 2014-12-01 DIAGNOSIS — N6091 Unspecified benign mammary dysplasia of right breast: Secondary | ICD-10-CM | POA: Diagnosis not present

## 2014-12-01 DIAGNOSIS — J45909 Unspecified asthma, uncomplicated: Secondary | ICD-10-CM | POA: Diagnosis not present

## 2014-12-01 DIAGNOSIS — Z79899 Other long term (current) drug therapy: Secondary | ICD-10-CM | POA: Diagnosis not present

## 2014-12-01 DIAGNOSIS — N6011 Diffuse cystic mastopathy of right breast: Secondary | ICD-10-CM | POA: Diagnosis not present

## 2014-12-01 LAB — CBC WITH DIFFERENTIAL/PLATELET
BASOS ABS: 0 10*3/uL (ref 0.0–0.1)
Basophils Relative: 0 %
EOS PCT: 1 %
Eosinophils Absolute: 0.1 10*3/uL (ref 0.0–0.7)
HCT: 37.2 % (ref 36.0–46.0)
HEMOGLOBIN: 12.5 g/dL (ref 12.0–15.0)
LYMPHS ABS: 2 10*3/uL (ref 0.7–4.0)
LYMPHS PCT: 31 %
MCH: 32.1 pg (ref 26.0–34.0)
MCHC: 33.6 g/dL (ref 30.0–36.0)
MCV: 95.4 fL (ref 78.0–100.0)
Monocytes Absolute: 0.8 10*3/uL (ref 0.1–1.0)
Monocytes Relative: 12 %
NEUTROS PCT: 56 %
Neutro Abs: 3.6 10*3/uL (ref 1.7–7.7)
PLATELETS: 279 10*3/uL (ref 150–400)
RBC: 3.9 MIL/uL (ref 3.87–5.11)
RDW: 12.3 % (ref 11.5–15.5)
WBC: 6.5 10*3/uL (ref 4.0–10.5)

## 2014-12-01 LAB — BASIC METABOLIC PANEL
ANION GAP: 7 (ref 5–15)
BUN: 10 mg/dL (ref 6–20)
CHLORIDE: 105 mmol/L (ref 101–111)
CO2: 26 mmol/L (ref 22–32)
Calcium: 9.3 mg/dL (ref 8.9–10.3)
Creatinine, Ser: 0.86 mg/dL (ref 0.44–1.00)
Glucose, Bld: 127 mg/dL — ABNORMAL HIGH (ref 65–99)
POTASSIUM: 3.8 mmol/L (ref 3.5–5.1)
SODIUM: 138 mmol/L (ref 135–145)

## 2014-12-06 ENCOUNTER — Ambulatory Visit
Admission: RE | Admit: 2014-12-06 | Discharge: 2014-12-06 | Disposition: A | Discharge status: Home / Self Care | Payer: Commercial Managed Care - HMO | Source: Ambulatory Visit | Attending: General Surgery | Admitting: General Surgery

## 2014-12-06 DIAGNOSIS — N6310 Unspecified lump in the right breast, unspecified quadrant: Secondary | ICD-10-CM

## 2014-12-08 ENCOUNTER — Ambulatory Visit (HOSPITAL_BASED_OUTPATIENT_CLINIC_OR_DEPARTMENT_OTHER)
Admission: RE | Admit: 2014-12-08 | Discharge: 2014-12-08 | Disposition: A | Discharge status: Home / Self Care | Payer: Commercial Managed Care - HMO | Source: Ambulatory Visit | Attending: General Surgery | Admitting: General Surgery

## 2014-12-08 ENCOUNTER — Encounter (HOSPITAL_BASED_OUTPATIENT_CLINIC_OR_DEPARTMENT_OTHER)
Admission: RE | Disposition: A | Discharge status: Home / Self Care | Payer: Self-pay | Source: Ambulatory Visit | Attending: General Surgery

## 2014-12-08 ENCOUNTER — Encounter (HOSPITAL_BASED_OUTPATIENT_CLINIC_OR_DEPARTMENT_OTHER): Payer: Self-pay | Admitting: Anesthesiology

## 2014-12-08 ENCOUNTER — Ambulatory Visit (HOSPITAL_BASED_OUTPATIENT_CLINIC_OR_DEPARTMENT_OTHER): Payer: Commercial Managed Care - HMO | Admitting: Anesthesiology

## 2014-12-08 ENCOUNTER — Ambulatory Visit
Admission: RE | Admit: 2014-12-08 | Discharge: 2014-12-08 | Disposition: A | Discharge status: Home / Self Care | Payer: Commercial Managed Care - HMO | Source: Ambulatory Visit | Attending: General Surgery | Admitting: General Surgery

## 2014-12-08 DIAGNOSIS — J45909 Unspecified asthma, uncomplicated: Secondary | ICD-10-CM | POA: Insufficient documentation

## 2014-12-08 DIAGNOSIS — N6091 Unspecified benign mammary dysplasia of right breast: Secondary | ICD-10-CM | POA: Diagnosis not present

## 2014-12-08 DIAGNOSIS — Z79899 Other long term (current) drug therapy: Secondary | ICD-10-CM | POA: Insufficient documentation

## 2014-12-08 DIAGNOSIS — N6310 Unspecified lump in the right breast, unspecified quadrant: Secondary | ICD-10-CM

## 2014-12-08 DIAGNOSIS — N6011 Diffuse cystic mastopathy of right breast: Secondary | ICD-10-CM | POA: Insufficient documentation

## 2014-12-08 HISTORY — DX: Anxiety disorder, unspecified: F41.9

## 2014-12-08 HISTORY — PX: RADIOACTIVE SEED GUIDED EXCISIONAL BREAST BIOPSY: SHX6490

## 2014-12-08 SURGERY — RADIOACTIVE SEED GUIDED BREAST BIOPSY
Anesthesia: General | Site: Breast | Laterality: Right

## 2014-12-08 MED ORDER — DEXAMETHASONE SODIUM PHOSPHATE 10 MG/ML IJ SOLN
INTRAMUSCULAR | Status: AC
  Filled 2014-12-08: qty 1

## 2014-12-08 MED ORDER — CEFAZOLIN SODIUM-DEXTROSE 2-3 GM-% IV SOLR
INTRAVENOUS | Status: AC
  Filled 2014-12-08: qty 50

## 2014-12-08 MED ORDER — HYDROMORPHONE HCL 1 MG/ML IJ SOLN
INTRAMUSCULAR | Status: AC
  Filled 2014-12-08: qty 1

## 2014-12-08 MED ORDER — FENTANYL CITRATE (PF) 100 MCG/2ML IJ SOLN
INTRAMUSCULAR | Status: DC | PRN
  Administered 2014-12-08: 100 ug via INTRAVENOUS
  Administered 2014-12-08 (×2): 50 ug via INTRAVENOUS

## 2014-12-08 MED ORDER — SODIUM CHLORIDE 0.9 % IJ SOLN
INTRAMUSCULAR | Status: AC
  Filled 2014-12-08: qty 10

## 2014-12-08 MED ORDER — SCOPOLAMINE 1 MG/3DAYS TD PT72: 1.0000 | MEDICATED_PATCH | Freq: Once | TRANSDERMAL | Status: DC | PRN

## 2014-12-08 MED ORDER — FENTANYL CITRATE (PF) 100 MCG/2ML IJ SOLN
INTRAMUSCULAR | Status: AC
  Filled 2014-12-08: qty 2

## 2014-12-08 MED ORDER — KETOROLAC TROMETHAMINE 30 MG/ML IJ SOLN: 30.0000 mg | Freq: Once | INTRAMUSCULAR | Status: DC | PRN

## 2014-12-08 MED ORDER — PROMETHAZINE HCL 25 MG/ML IJ SOLN
6.2500 mg | INTRAMUSCULAR | Status: DC | PRN
  Administered 2014-12-08: 6.25 mg via INTRAVENOUS

## 2014-12-08 MED ORDER — GLYCOPYRROLATE 0.2 MG/ML IJ SOLN: 0.2000 mg | Freq: Once | INTRAMUSCULAR | Status: DC | PRN

## 2014-12-08 MED ORDER — BUPIVACAINE HCL (PF) 0.25 % IJ SOLN
INTRAMUSCULAR | Status: DC | PRN
  Administered 2014-12-08: 10 mL

## 2014-12-08 MED ORDER — LACTATED RINGERS IV SOLN
INTRAVENOUS | Status: DC
  Administered 2014-12-08 (×2): via INTRAVENOUS

## 2014-12-08 MED ORDER — DEXAMETHASONE SODIUM PHOSPHATE 4 MG/ML IJ SOLN
INTRAMUSCULAR | Status: DC | PRN
  Administered 2014-12-08: 10 mg via INTRAVENOUS

## 2014-12-08 MED ORDER — MIDAZOLAM HCL 2 MG/2ML IJ SOLN
INTRAMUSCULAR | Status: AC
  Filled 2014-12-08: qty 2

## 2014-12-08 MED ORDER — PROMETHAZINE HCL 25 MG/ML IJ SOLN
INTRAMUSCULAR | Status: AC
  Filled 2014-12-08: qty 1

## 2014-12-08 MED ORDER — SCOPOLAMINE 1 MG/3DAYS TD PT72
MEDICATED_PATCH | TRANSDERMAL | Status: AC
  Filled 2014-12-08: qty 1

## 2014-12-08 MED ORDER — ONDANSETRON HCL 4 MG/2ML IJ SOLN
INTRAMUSCULAR | Status: DC | PRN
  Administered 2014-12-08: 4 mg via INTRAVENOUS

## 2014-12-08 MED ORDER — MIDAZOLAM HCL 2 MG/2ML IJ SOLN: 1.0000 mg | INTRAMUSCULAR | Status: DC | PRN

## 2014-12-08 MED ORDER — LIDOCAINE HCL (CARDIAC) 20 MG/ML IV SOLN
INTRAVENOUS | Status: AC
  Filled 2014-12-08: qty 5

## 2014-12-08 MED ORDER — PROPOFOL 10 MG/ML IV BOLUS
INTRAVENOUS | Status: AC
  Filled 2014-12-08: qty 40

## 2014-12-08 MED ORDER — ONDANSETRON HCL 4 MG/2ML IJ SOLN
INTRAMUSCULAR | Status: AC
  Filled 2014-12-08: qty 2

## 2014-12-08 MED ORDER — FENTANYL CITRATE (PF) 100 MCG/2ML IJ SOLN: 50.0000 ug | INTRAMUSCULAR | Status: DC | PRN

## 2014-12-08 MED ORDER — HYDROMORPHONE HCL 1 MG/ML IJ SOLN
0.2500 mg | INTRAMUSCULAR | Status: DC | PRN
Start: 2014-12-08 — End: 2014-12-08
  Administered 2014-12-08 (×4): 0.5 mg via INTRAVENOUS

## 2014-12-08 MED ORDER — CEFAZOLIN SODIUM-DEXTROSE 2-3 GM-% IV SOLR
2.0000 g | INTRAVENOUS | Status: AC
  Administered 2014-12-08: 2 g via INTRAVENOUS

## 2014-12-08 MED ORDER — MIDAZOLAM HCL 5 MG/5ML IJ SOLN
INTRAMUSCULAR | Status: DC | PRN
  Administered 2014-12-08: 2 mg via INTRAVENOUS

## 2014-12-08 MED ORDER — KETOROLAC TROMETHAMINE 30 MG/ML IJ SOLN
INTRAMUSCULAR | Status: AC
  Filled 2014-12-08: qty 1

## 2014-12-08 MED ORDER — PROPOFOL 10 MG/ML IV BOLUS
INTRAVENOUS | Status: DC | PRN
  Administered 2014-12-08: 200 mg via INTRAVENOUS

## 2014-12-08 MED ORDER — GLYCOPYRROLATE 0.2 MG/ML IJ SOLN
INTRAMUSCULAR | Status: AC
  Filled 2014-12-08: qty 1

## 2014-12-08 MED ORDER — HYDROCODONE-ACETAMINOPHEN 10-325 MG PO TABS: 1.0000 | ORAL_TABLET | Freq: Four times a day (QID) | ORAL | Status: DC | PRN

## 2014-12-08 MED ORDER — LIDOCAINE HCL (CARDIAC) 20 MG/ML IV SOLN
INTRAVENOUS | Status: DC | PRN
  Administered 2014-12-08: 50 mg via INTRAVENOUS

## 2014-12-08 SURGICAL SUPPLY — 57 items
APPLIER CLIP 9.375 MED OPEN (MISCELLANEOUS)
APR CLP MED 9.3 20 MLT OPN (MISCELLANEOUS)
BINDER BREAST LRG (GAUZE/BANDAGES/DRESSINGS) IMPLANT
BINDER BREAST MEDIUM (GAUZE/BANDAGES/DRESSINGS) IMPLANT
BINDER BREAST XLRG (GAUZE/BANDAGES/DRESSINGS) ×1 IMPLANT
BINDER BREAST XXLRG (GAUZE/BANDAGES/DRESSINGS) IMPLANT
BLADE SURG 15 STRL LF DISP TIS (BLADE) ×1 IMPLANT
BLADE SURG 15 STRL SS (BLADE) ×2
CANISTER SUC SOCK COL 7IN (MISCELLANEOUS) ×1 IMPLANT
CANISTER SUCT 1200ML W/VALVE (MISCELLANEOUS) IMPLANT
CHLORAPREP W/TINT 26ML (MISCELLANEOUS) ×2 IMPLANT
CLIP APPLIE 9.375 MED OPEN (MISCELLANEOUS) IMPLANT
COVER BACK TABLE 60X90IN (DRAPES) ×2 IMPLANT
COVER MAYO STAND STRL (DRAPES) ×2 IMPLANT
COVER PROBE W GEL 5X96 (DRAPES) ×2 IMPLANT
DECANTER SPIKE VIAL GLASS SM (MISCELLANEOUS) IMPLANT
DEVICE DUBIN W/COMP PLATE 8390 (MISCELLANEOUS) ×2 IMPLANT
DRAPE LAPAROSCOPIC ABDOMINAL (DRAPES) ×2 IMPLANT
DRAPE UTILITY XL STRL (DRAPES) ×2 IMPLANT
DRSG TEGADERM 4X4.75 (GAUZE/BANDAGES/DRESSINGS) IMPLANT
ELECT COATED BLADE 2.86 ST (ELECTRODE) ×2 IMPLANT
ELECT REM PT RETURN 9FT ADLT (ELECTROSURGICAL) ×2
ELECTRODE REM PT RTRN 9FT ADLT (ELECTROSURGICAL) ×1 IMPLANT
GLOVE BIO SURGEON STRL SZ7 (GLOVE) ×4 IMPLANT
GLOVE BIOGEL PI IND STRL 7.5 (GLOVE) ×1 IMPLANT
GLOVE BIOGEL PI INDICATOR 7.5 (GLOVE) ×1
GOWN STRL REUS W/ TWL LRG LVL3 (GOWN DISPOSABLE) ×2 IMPLANT
GOWN STRL REUS W/TWL LRG LVL3 (GOWN DISPOSABLE) ×4
ILLUMINATOR WAVEGUIDE N/F (MISCELLANEOUS) ×1 IMPLANT
KIT MARKER MARGIN INK (KITS) ×2 IMPLANT
LIGHT WAVEGUIDE WIDE FLAT (MISCELLANEOUS) IMPLANT
LIQUID BAND (GAUZE/BANDAGES/DRESSINGS) ×2 IMPLANT
MARKER SKIN DUAL TIP RULER LAB (MISCELLANEOUS) ×2 IMPLANT
NDL HYPO 25X1 1.5 SAFETY (NEEDLE) ×1 IMPLANT
NEEDLE HYPO 25X1 1.5 SAFETY (NEEDLE) ×2 IMPLANT
NS IRRIG 1000ML POUR BTL (IV SOLUTION) IMPLANT
PACK BASIN DAY SURGERY FS (CUSTOM PROCEDURE TRAY) ×2 IMPLANT
PENCIL BUTTON HOLSTER BLD 10FT (ELECTRODE) ×2 IMPLANT
SHEET MEDIUM DRAPE 40X70 STRL (DRAPES) IMPLANT
SLEEVE SCD COMPRESS KNEE MED (MISCELLANEOUS) ×2 IMPLANT
SPONGE GAUZE 4X4 12PLY STER LF (GAUZE/BANDAGES/DRESSINGS) IMPLANT
SPONGE LAP 4X18 X RAY DECT (DISPOSABLE) ×2 IMPLANT
STAPLER VISISTAT 35W (STAPLE) IMPLANT
STRIP CLOSURE SKIN 1/2X4 (GAUZE/BANDAGES/DRESSINGS) ×2 IMPLANT
SUT MNCRL AB 4-0 PS2 18 (SUTURE) IMPLANT
SUT MON AB 5-0 PS2 18 (SUTURE) ×1 IMPLANT
SUT SILK 2 0 SH (SUTURE) IMPLANT
SUT VIC AB 2-0 SH 27 (SUTURE) ×4
SUT VIC AB 2-0 SH 27XBRD (SUTURE) ×1 IMPLANT
SUT VIC AB 3-0 SH 27 (SUTURE) ×2
SUT VIC AB 3-0 SH 27X BRD (SUTURE) ×1 IMPLANT
SUT VIC AB 5-0 PS2 18 (SUTURE) IMPLANT
SYR CONTROL 10ML LL (SYRINGE) ×2 IMPLANT
TOWEL OR 17X24 6PK STRL BLUE (TOWEL DISPOSABLE) ×2 IMPLANT
TOWEL OR NON WOVEN STRL DISP B (DISPOSABLE) ×2 IMPLANT
TUBE CONNECTING 20X1/4 (TUBING) ×1 IMPLANT
YANKAUER SUCT BULB TIP NO VENT (SUCTIONS) ×1 IMPLANT

## 2014-12-08 NOTE — Anesthesia Preprocedure Evaluation (Signed)
Anesthesia Evaluation  Patient identified by MRN, date of birth, ID band Patient awake    Reviewed: Allergy & Precautions, NPO status , Patient's Chart, lab work & pertinent test results  Airway Mallampati: II  TM Distance: >3 FB Neck ROM: Full    Dental no notable dental hx.    Pulmonary asthma ,    Pulmonary exam normal breath sounds clear to auscultation       Cardiovascular negative cardio ROS Normal cardiovascular exam Rhythm:Regular Rate:Normal     Neuro/Psych negative neurological ROS  negative psych ROS   GI/Hepatic negative GI ROS, Neg liver ROS,   Endo/Other  negative endocrine ROS  Renal/GU negative Renal ROS  negative genitourinary   Musculoskeletal negative musculoskeletal ROS (+)   Abdominal   Peds negative pediatric ROS (+)  Hematology negative hematology ROS (+)   Anesthesia Other Findings   Reproductive/Obstetrics negative OB ROS                             Anesthesia Physical Anesthesia Plan  ASA: II  Anesthesia Plan: General   Post-op Pain Management:    Induction: Intravenous  Airway Management Planned: LMA  Additional Equipment:   Intra-op Plan:   Post-operative Plan: Extubation in OR  Informed Consent: I have reviewed the patients History and Physical, chart, labs and discussed the procedure including the risks, benefits and alternatives for the proposed anesthesia with the patient or authorized representative who has indicated his/her understanding and acceptance.   Dental advisory given  Plan Discussed with: CRNA and Surgeon  Anesthesia Plan Comments:         Anesthesia Quick Evaluation

## 2014-12-08 NOTE — Anesthesia Procedure Notes (Signed)
Procedure Name: LMA Insertion Date/Time: 12/08/2014 12:22 PM Performed by: Toula Moos L Pre-anesthesia Checklist: Patient identified, Emergency Drugs available, Suction available, Patient being monitored and Timeout performed Patient Re-evaluated:Patient Re-evaluated prior to inductionOxygen Delivery Method: Circle System Utilized Preoxygenation: Pre-oxygenation with 100% oxygen Intubation Type: IV induction Ventilation: Mask ventilation without difficulty LMA: LMA inserted LMA Size: 4.0 Number of attempts: 1 Airway Equipment and Method: Bite block Placement Confirmation: positive ETCO2 Tube secured with: Tape Dental Injury: Teeth and Oropharynx as per pre-operative assessment

## 2014-12-08 NOTE — Anesthesia Postprocedure Evaluation (Signed)
Anesthesia Post Note  Patient: Nichole Arnold  Procedure(s) Performed: Procedure(s) (LRB): RADIOACTIVE SEED GUIDED EXCISIONAL BREAST BIOPSY (Right)  Patient location during evaluation: PACU Anesthesia Type: General Level of consciousness: awake and alert Pain management: pain level controlled Vital Signs Assessment: post-procedure vital signs reviewed and stable Respiratory status: spontaneous breathing, nonlabored ventilation, respiratory function stable and patient connected to nasal cannula oxygen Cardiovascular status: blood pressure returned to baseline and stable Postop Assessment: no signs of nausea or vomiting Anesthetic complications: no    Last Vitals:  Filed Vitals:   12/08/14 1345 12/08/14 1400  BP: 120/77 112/71  Pulse: 64 58  Temp:    Resp: 13 10    Last Pain:  Filed Vitals:   12/08/14 1404  PainSc: 7                  Darby Fleeman S

## 2014-12-08 NOTE — Interval H&P Note (Signed)
History and Physical Interval Note:  12/08/2014 11:59 AM  Nichole Arnold  has presented today for surgery, with the diagnosis of RIGHT BREAST MASS  The various methods of treatment have been discussed with the patient and family. After consideration of risks, benefits and other options for treatment, the patient has consented to  Procedure(s): RADIOACTIVE SEED GUIDED EXCISIONAL BREAST BIOPSY (Right) as a surgical intervention .  The patient's history has been reviewed, patient examined, no change in status, stable for surgery.  I have reviewed the patient's chart and labs.  Questions were answered to the patient's satisfaction.     [name]

## 2014-12-08 NOTE — Transfer of Care (Signed)
Immediate Anesthesia Transfer of Care Note  Patient: Nichole Arnold  Procedure(s) Performed: Procedure(s): RADIOACTIVE SEED GUIDED EXCISIONAL BREAST BIOPSY (Right)  Patient Location: PACU  Anesthesia Type:General  Level of Consciousness: alert  and patient cooperative  Airway & Oxygen Therapy: Patient Spontanous Breathing and Patient connected to face mask oxygen  Post-op Assessment: Report given to RN and Post -op Vital signs reviewed and stable  Post vital signs: Reviewed and stable  Last Vitals:  Filed Vitals:   12/08/14 1034  BP: 110/59  Pulse: 59  Temp: 36.9 C  Resp: 20    Complications: No apparent anesthesia complications

## 2014-12-08 NOTE — H&P (Signed)
49 yof who has no personal history of breast issues and a mgm who had breast cancer in 70s presents with abnl screening mm. she has several mm and a nl mri over last year and a half. most recent 3D mm showed density c, architectural distortion in outer right breast that underwent stereo biopsy showing alh and csl. she has no issues with either breast.   Other Problems Illene Regulus, CMA; 11/05/2014 10:35 AM) Cancer  Past Surgical History Lars Mage Spillers, CMA; 11/05/2014 10:35 AM) Oral Surgery Cesarean Section - 1 Breast Biopsy Right.  Diagnostic Studies History Illene Regulus, CMA; 11/05/2014 10:35 AM) Pap Smear 1-5 years ago Mammogram within last year Colonoscopy never  Allergies Rolm Bookbinder, MD; 11/05/2014 10:24 AM) No Known Drug Allergies10/28/2016  Medication History Rolm Bookbinder, MD; 11/05/2014 10:24 AM) Albuterol Sulfate ((2.5 MG/3ML)0.083% Nebulized Soln, Inhalation) Active. ALPRAZolam (0.25MG  Tablet, Oral prn) Active. ZyrTEC Allergy (10MG  Capsule, Oral prn) Active. Advair Diskus (100-50MCG/DOSE Aero Pow Br Act, Inhalation prn) Active. Ibuprofen (200MG  Tablet, Oral prn) Active. Meclizine HCl (25MG  Tablet, Oral prn) Active.  Social History Illene Regulus, CMA; 11/05/2014 10:35 AM) No drug use Tobacco use Never smoker. Alcohol use Moderate alcohol use. Caffeine use Coffee.  Family History Illene Regulus, CMA; 11/05/2014 10:35 AM) Colon Polyps Father. Depression Mother. Melanoma Father. Alcohol Abuse Father. Breast Cancer Family Members In General. Cancer Family Members In General, Mother.  Pregnancy / Birth History Illene Regulus, CMA; 11/05/2014 10:35 AM) Age at menarche 15 years. Contraceptive History Oral contraceptives. Gravida 3 Irregular periods Maternal age 10-25 Para 1    Review of Systems Lars Mage Spillers CMA; 11/05/2014 10:35 AM) General Not Present- Appetite Loss, Chills, Fatigue, Fever,  Night Sweats, Weight Gain and Weight Loss. Skin Not Present- Change in Wart/Mole, Dryness, Hives, Jaundice, New Lesions, Non-Healing Wounds, Rash and Ulcer. HEENT Present- Seasonal Allergies and Wears glasses/contact lenses. Not Present- Earache, Hearing Loss, Hoarseness, Nose Bleed, Oral Ulcers, Ringing in the Ears, Sinus Pain, Sore Throat, Visual Disturbances and Yellow Eyes. Respiratory Not Present- Bloody sputum, Chronic Cough, Difficulty Breathing, Snoring and Wheezing. Breast Not Present- Breast Mass, Breast Pain, Nipple Discharge and Skin Changes. Cardiovascular Not Present- Chest Pain, Difficulty Breathing Lying Down, Leg Cramps, Palpitations, Rapid Heart Rate, Shortness of Breath and Swelling of Extremities. Gastrointestinal Not Present- Abdominal Pain, Bloating, Bloody Stool, Change in Bowel Habits, Chronic diarrhea, Constipation, Difficulty Swallowing, Excessive gas, Gets full quickly at meals, Hemorrhoids, Indigestion, Nausea, Rectal Pain and Vomiting. Female Genitourinary Not Present- Frequency, Nocturia, Painful Urination, Pelvic Pain and Urgency. Musculoskeletal Not Present- Back Pain, Joint Pain, Joint Stiffness, Muscle Pain, Muscle Weakness and Swelling of Extremities. Neurological Not Present- Decreased Memory, Fainting, Headaches, Numbness, Seizures, Tingling, Tremor, Trouble walking and Weakness. Psychiatric Not Present- Anxiety, Bipolar, Change in Sleep Pattern, Depression, Fearful and Frequent crying. Endocrine Not Present- Cold Intolerance, Excessive Hunger, Hair Changes, Heat Intolerance, Hot flashes and New Diabetes. Hematology Not Present- Easy Bruising, Excessive bleeding, Gland problems, HIV and Persistent Infections.   Physical Exam Rolm Bookbinder MD; 11/05/2014 10:23 AM) General Mental Status-Alert. Orientation-Oriented X3.  Chest and Lung Exam Chest and lung exam reveals -on auscultation, normal breath sounds, no adventitious sounds and normal vocal  resonance.  Breast Nipples-No Discharge. Breast Lump-No Palpable Breast Mass. Note: lower outer quadrant right breast hematoma   Cardiovascular Cardiovascular examination reveals -normal heart sounds, regular rate and rhythm with no murmurs.  Lymphatic Head & Neck  General Head & Neck Lymphatics: Bilateral - Description - Normal. Axillary  General Axillary Region: Bilateral - Description -  Normal.    Assessment & Plan Rolm Bookbinder MD; 11/05/2014 10:24 AM) MASS OF RIGHT BREAST ON MAMMOGRAM (N63) Story: discussed radioactive seed guided excisional biopsy due to upgrade of lesion found on core. will wait a couple weeks for hematoma to resolve. risks, recovery, possible finding of cancer, possible referral to risk reduction discussed

## 2014-12-08 NOTE — Op Note (Signed)
Preoperative diagnosis: Right breast mass on mm, core biopsy with adh Postoperative diagnosis: same as above Procedure: Right breast mass seed guided excisional biopsy Surgeon: Dr Serita Grammes Anesthesia: general EBL: minimal Drains none Complications none Specimen right breast tissue marked with paint Sponge and needle count was correct to completion Suspicion to recovery stable  Indications: This is a 5 yof with right breast mass on mm that underwent core biopsy with finding of adh.  She and I discussed seed guided excision of this mass. She had a seed placed prior to beginning and I had her mm in the OR.   Procedure: After informed consent was obtained the patient was taken to the operating room. She was given antibiotics. Sequential compression devices were on her legs. She was then placed under general anesthesia without complication. She was prepped and draped in the standard sterile surgical fashion. A surgical timeout was then performed.  I infiltrated 1% lidocaine and made a periareolar incision after locating the seed.  This was in the lower outer quadrant and I used the lighted retractors to dissect over to the area.   I then removed the seed and the surrounding very dense tissue.  There was a small hematoma still present. Hemostasis was observed. I painted the specimen. Mammogram confirmed removal of the seed and clip. I then closed this with 2-0 Vicryl, 3-0 Vicryl, and 5-0 Monocryl. Glue was placed over the wound.She tolerated this well was extubated and transferred to recovery in stable condition

## 2014-12-08 NOTE — Discharge Instructions (Signed)
[facility] °Office Phone Number [phone] ° °POST OP INSTRUCTIONS ° °Always review your discharge instruction sheet given to you by the facility where your surgery was performed. ° °IF YOU HAVE DISABILITY OR FAMILY LEAVE FORMS, YOU MUST BRING THEM TO THE OFFICE FOR PROCESSING.  DO NOT GIVE THEM TO YOUR DOCTOR. ° °1. A prescription for pain medication may be given to you upon discharge.  Take your pain medication as prescribed, if needed.  If narcotic pain medicine is not needed, then you may take acetaminophen (Tylenol), naprosyn (Alleve) or ibuprofen (Advil) as needed. °2. Take your usually prescribed medications unless otherwise directed °3. If you need a refill on your pain medication, please contact your pharmacy.  They will contact our office to request authorization.  Prescriptions will not be filled after 5pm or on week-ends. °4. You should eat very light the first 24 hours after surgery, such as soup, crackers, pudding, etc.  Resume your normal diet the day after surgery. °5. Most patients will experience some swelling and bruising in the breast.  Ice packs and a good support bra will help.  Wear the breast binder provided or a sports bra for 72 hours day and night.  After that wear a sports bra during the day until you return to the office. Swelling and bruising can take several days to resolve.  °6. It is common to experience some constipation if taking pain medication after surgery.  Increasing fluid intake and taking a stool softener will usually help or prevent this problem from occurring.  A mild laxative (Milk of Magnesia or Miralax) should be taken according to package directions if there are no bowel movements after 48 hours. °7. Unless discharge instructions indicate otherwise, you may remove your bandages 48 hours after surgery and you may shower at that time.  You may have steri-strips (small skin tapes) in place directly over the incision.  These strips should be left on the  skin for 7-10 days and will come off on their own.  If your surgeon used skin glue on the incision, you may shower in 24 hours.  The glue will flake off over the next 2-3 weeks.  Any sutures or staples will be removed at the office during your follow-up visit. °8. ACTIVITIES:  You may resume regular daily activities (gradually increasing) beginning the next day.  Wearing a good support bra or sports bra minimizes pain and swelling.  You may have sexual intercourse when it is comfortable. °a. You may drive when you no longer are taking prescription pain medication, you can comfortably wear a seatbelt, and you can safely maneuver your car and apply brakes. °b. RETURN TO WORK:  ______________________________________________________________________________________ °9. You should see your doctor in the office for a follow-up appointment approximately two weeks after your surgery.  Your doctor’s nurse will typically make your follow-up appointment when she calls you with your pathology report.  Expect your pathology report 3-4 business days after your surgery.  You may call to check if you do not hear from us after three days. °10. OTHER INSTRUCTIONS: _______________________________________________________________________________________________ _____________________________________________________________________________________________________________________________________ °_____________________________________________________________________________________________________________________________________ °_____________________________________________________________________________________________________________________________________ ° °WHEN TO CALL DR WAKEFIELD: °1. Fever over 101.0 °2. Nausea and/or vomiting. °3. Extreme swelling or bruising. °4. Continued bleeding from incision. °5. Increased pain, redness, or drainage from the incision. ° °The clinic staff is available to answer your questions during regular  business hours.  Please don’t hesitate to call and ask to speak to one of the nurses for clinical concerns.  If   you have a medical emergency, go to the nearest emergency room or call 911.  A surgeon from [facility] is always on call at the hospital. ° °For further questions, please visit [email or web address] ° ° ° °Post Anesthesia Home Care Instructions ° °Activity: °Get plenty of rest for the remainder of the day. A responsible adult should stay with you for 24 hours following the procedure.  °For the next 24 hours, DO NOT: °-Drive a car °-Operate machinery °-Drink alcoholic beverages °-Take any medication unless instructed by your physician °-Make any legal decisions or sign important papers. ° °Meals: °Start with liquid foods such as gelatin or soup. Progress to regular foods as tolerated. Avoid greasy, spicy, heavy foods. If nausea and/or vomiting occur, drink only clear liquids until the nausea and/or vomiting subsides. Call your physician if vomiting continues. ° °Special Instructions/Symptoms: °Your throat may feel dry or sore from the anesthesia or the breathing tube placed in your throat during surgery. If this causes discomfort, gargle with warm salt water. The discomfort should disappear within 24 hours. ° °If you had a scopolamine patch placed behind your ear for the management of post- operative nausea and/or vomiting: ° °1. The medication in the patch is effective for 72 hours, after which it should be removed.  Wrap patch in a tissue and discard in the trash. Wash hands thoroughly with soap and water. °2. You may remove the patch earlier than 72 hours if you experience unpleasant side effects which may include dry mouth, dizziness or visual disturbances. °3. Avoid touching the patch. Wash your hands with soap and water after contact with the patch. °  ° °

## 2014-12-09 ENCOUNTER — Encounter (HOSPITAL_BASED_OUTPATIENT_CLINIC_OR_DEPARTMENT_OTHER): Payer: Self-pay | Admitting: General Surgery

## 2014-12-29 ENCOUNTER — Telehealth: Payer: Self-pay | Admitting: Oncology

## 2014-12-29 NOTE — Telephone Encounter (Signed)
High Risk Appt-s/w patient and gave appt for 01/04 @ 4:30 w/Dr. Jana Hakim.

## 2014-12-30 ENCOUNTER — Telehealth: Payer: Self-pay | Admitting: *Deleted

## 2014-12-30 NOTE — Telephone Encounter (Signed)
Mailed new pt packet to pt.  

## 2015-01-12 ENCOUNTER — Ambulatory Visit (HOSPITAL_BASED_OUTPATIENT_CLINIC_OR_DEPARTMENT_OTHER): Payer: Commercial Managed Care - HMO | Admitting: Oncology

## 2015-01-12 VITALS — BP 107/55 | HR 67 | Temp 98.2°F | Resp 20 | Ht 70.0 in | Wt 207.0 lb

## 2015-01-12 DIAGNOSIS — Z1239 Encounter for other screening for malignant neoplasm of breast: Secondary | ICD-10-CM

## 2015-01-12 DIAGNOSIS — N6091 Unspecified benign mammary dysplasia of right breast: Secondary | ICD-10-CM

## 2015-01-12 NOTE — Progress Notes (Signed)
[facility]  [phone] [phone]     ID: Nichole Arnold DOB: 07-Mar-1965  MR#: [id number]  [id number]  Patient Care Team: Nichole Greenspan, MD as PCP - General Nichole Cruel, MD as Consulting Physician (Oncology) Nichole Bookbinder, MD as Consulting Physician (General Surgery) Nichole Nigh, MD as Consulting Physician (Obstetrics and Gynecology) Nichole Luria, MD as Consulting Physician (Dermatology) PCP: Nichole Pardon, MD OTHER MD:  CHIEF COMPLAINT: atypical ductal hyperplasia  CURRENT TREATMENT: considering tamoxifen   BREAST CANCER HISTORY: The patient had screening mammography at Dr. Sherran Arnold office August 2015 showing a possible distortion in the right breast. She underwent right diagnostic mammography and [facility]/27/2013. The breast density was category C.the apparent distortion resolved on spot compression. There was no palpable mass. Ultrasound showed no concerning mass in the area in question. This was followed by bilateral breast MRI 10/02/2016which suggested the breast composition to be category B. The exam was essentially unremarkable, with no evidence of malignancy in either breast.  She was recalled for six-month follow-up with the right diagnostic mammography with tomosynthesis 03/25/2014. Again breast density was category B and the question area of distortion from the August 2015 mammogram was "less apparent".bilateral diagnostic mammography with tomosynthesis 10/13/2014 found the breast density to be category C. This time there was more definite architectural distortion in the lower outer quadrant of the right breast, with no other findings of concern, and on 10/18/2014 the patient underwent right breast lower outer quadrant biopsy with the pathology (SAA [id number] was (showing atypical lobular hyperplasia/ complex sclerosing lesion.  The patient was then referred to surgery and after  appropriate discussion underwent excision of the area in question !!?#)?@)!^> The final pathology ([id number] [id number]) showed atypical ductal hyperplasia involving a radial scar.the patient was then referred for high-risk management.  INTERVAL HISTORY: Nichole Arnold was evaluated in the high risk clinic 01/12/2015.  REVIEW OF SYSTEMS: The patient admits to significant stress relating to her family and work issues. She did well with her recent surgery, without bleeding, fever, or unusual pain. She is very pleased with the cosmetic result. She denies hot flashes but admits to night sweats. She exercises regularly normally, although she has been exercising less recently because of a soft ball injury in the summer and then the more recent breast surgery. She is also concerned that her diet is more catch as catch can then planned. A detailed review of systems today was otherwise noncontributory  PAST MEDICAL HISTORY: Past Medical History  Diagnosis Date  . Allergic rhinitis   . Asthma   . Basal cell carcinoma of forehead   . Basal cell carcinoma of ear     Left  . Benign paroxysmal positional vertigo   . Anxiety     PAST SURGICAL HISTORY: Past Surgical History  Procedure Laterality Date  . Cervical cone biopsy    . Melanoma excision      pre melanoma  . Cesarean section  12/00  . Neuroma surgery  6/02    finger  . Basal cell carcinoma excision  9/08    left ear  . Radioactive seed guided excisional breast biopsy Right 12/08/2014    Procedure: RADIOACTIVE SEED GUIDED EXCISIONAL BREAST BIOPSY;  Surgeon: Nichole Bookbinder, MD;  Location: [facility];  Service: General;  Laterality: Right;    FAMILY HISTORY Family History  Problem Relation Age of Onset  . Asthma Mother   . Heart failure      GM  .  Stomach cancer      GF  . Breast cancer Maternal Grandmother 94  . Uterine cancer Maternal Grandmother   the patient's father is alive, age 5. The patient's mother died within the last 2  months from stomach cancer. She was 49 years old. The patient had no brothers. She has one sister. The only breast cancer in the family is that maternal grandmother was diagnosed at age 55. She took a "chemotherapy pill" for 5 years. There is no history of ovarian cancer in the family  GYNECOLOGIC HISTORY:  No LMP recorded. Menarche age 77, first live birth age 9. The patient is GX P1. She underwent an ablation procedure in 2014 and her periods have been scant and irregular since.  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 Their daughter Nichole Arnold, 31 years old, is interested in equestrian sports. The patient attends the [employer] friends meeting.    ADVANCED DIRECTIVES: in place  HEALTH MAINTENANCE: Social History  Substance Use Topics  . Smoking status: Never Smoker   . Smokeless tobacco: Not on file  . Alcohol Use: Yes     Comment: Occasional/ Social     Colonoscopy:  PAP:  Bone density:  Lipid panel:  No Known Allergies  Current Outpatient Prescriptions  Medication Sig Dispense Refill  . ALPRAZolam (XANAX) 0.25 MG tablet Take 1 tablet (0.25 mg total) by mouth daily as needed for sleep. 20 tablet 0  . cetirizine (ZYRTEC) 10 MG tablet Take 10 mg by mouth daily as needed.     . Fluticasone-Salmeterol (ADVAIR DISKUS) 100-50 MCG/DOSE AEPB INHALE ONE (1) PUFF EVERY 12 HOURS AS DIRECTED. RINSE MOUTH AFTER EACH USE. 60 each 11  . ibuprofen (ADVIL,MOTRIN) 200 MG tablet Take 800 mg by mouth every 6 (six) hours as needed for pain. Reported on 01/12/2015     No current facility-administered medications for this visit.    OBJECTIVE: middle-aged white woman in no acute distress Filed Vitals:   01/12/15 1628  BP: 107/55  Pulse: 67  Temp: 98.2 F (36.8 C)  Resp: 20     Body mass index is 29.7 kg/(m^2).    ECOG FS:0 - Asymptomatic  Ocular: Sclerae unicteric, pupils equal, round and reactive to light Ear-nose-throat: Oropharynx clear and  moist Lymphatic: No cervical or supraclavicular adenopathy Lungs no rales or rhonchi, good excursion bilaterally Heart regular rate and rhythm, no murmur appreciated Abd soft, nontender, positive bowel sounds MSK no focal spinal tenderness, no joint edema Neuro: non-focal, well-oriented, appropriate affect Breasts: the right breast is status post recent lumpectomy. The cosmetic result is excellent. The peri-areolar incision is flat, not erythematous, with no dehiscence. There is some induration inferiorly in the right breast. The right axilla is benign. The left breast is unremarkable   LAB RESULTS:  CMP     Component Value Date/Time   NA 138 12/01/2014 1200   K 3.8 12/01/2014 1200   CL 105 12/01/2014 1200   CO2 26 12/01/2014 1200   GLUCOSE 127* 12/01/2014 1200   BUN 10 12/01/2014 1200   CREATININE 0.86 12/01/2014 1200   CALCIUM 9.3 12/01/2014 1200   PROT 6.7 02/25/2014 0927   ALBUMIN 4.0 02/25/2014 0927   AST 20 02/25/2014 0927   ALT 16 02/25/2014 0927   ALKPHOS 44 02/25/2014 0927   BILITOT 0.4 02/25/2014 0927   GFRNONAA >60 12/01/2014 1200   GFRAA >60 12/01/2014 1200    INo results found for: SPEP, UPEP  Lab  Results  Component Value Date   WBC 6.5 12/01/2014   NEUTROABS 3.6 12/01/2014   HGB 12.5 12/01/2014   HCT 37.2 12/01/2014   MCV 95.4 12/01/2014   PLT 279 12/01/2014      Chemistry      Component Value Date/Time   NA 138 12/01/2014 1200   K 3.8 12/01/2014 1200   CL 105 12/01/2014 1200   CO2 26 12/01/2014 1200   BUN 10 12/01/2014 1200   CREATININE 0.86 12/01/2014 1200      Component Value Date/Time   CALCIUM 9.3 12/01/2014 1200   ALKPHOS 44 02/25/2014 0927   AST 20 02/25/2014 0927   ALT 16 02/25/2014 0927   BILITOT 0.4 02/25/2014 0927       No results found for: LABCA2  No components found for: [id number]  No results for input(s): INR in the last 168 hours.  Urinalysis No results found for: COLORURINE, APPEARANCEUR, LABSPEC, PHURINE, GLUCOSEU,  HGBUR, BILIRUBINUR, KETONESUR, PROTEINUR, UROBILINOGEN, NITRITE, LEUKOCYTESUR  STUDIES: Outside studies reviewed  ASSESSMENT: 50 y.o. [place], [place] woman status post right lumpectomy 12/08/2014 for radial scar showing atypical ductal hyperplasia  (1) breast density between categories B and C  ( 2) lifetime risk of breast cancer calculates to 26%.  PLAN: I spent approximately 50 minutes with the patient going over her situation in detail. We reviewed the fact that a late first delivery increases the risk of breast cancer almost 2 fold. By contrast miscarriages or abortions do not affect this risk. The presence of atypical ductal hyperplasia also increases risk, as does increased breast density, although that appears to be marginal in this patient. Finally there a history of breast cancer in the family in a maternal grandmother diagnosed in her 23s.  Given this and other data her risk of developing breast cancer in the next 5 years is approximately 3% and in her lifetime approximately 26%. This compares with an 11-12% risk for a "normal" woman her age  There are several ways to screen for breast cancer but the only one that has been shown to improve survival is yearly mammography pulled with a yearly physician breast exam. That data does not take a count of recent developments including tomography, which increases the sensitivity of diagnostic mammography by 10 or 15%. Of course breast MRI increases the sensitivity further but at increased cause not only economically but also in terms of false positives. Accordingly in terms of screening my recommendation is for her to have yearly mammography with tomography as well as a yearly physician breast exam(s) which she normally obtains through Dr. Glori Bickers and Dr. Radene Knee.  I also encouraged her to develop breast self awareness especially since there is some induration in the inferior aspect of the right breast that she should monitor for any changes.  In  terms of prevention, exercise (45 minutes, 5 times a week) as well as an adequate diet (which was discussed at length today) may reduce the risk of cancer in general by 1 or 2%.  In terms of specifically breast cancer prevention of course bilateral mastectomies would be an option but this was strongly discouraged as "overkill". Similarly patients can undergo ovarian ablation or suppression but she will be postmenopausal soon enough and I think the contribution from that maneuver would be minimal.  It would be more reasonable for her to consider anti-estrogens specifically tamoxifen, raloxifene, or toremifene. We discussed the possible toxicities, side effects and complications of these agents and she showed some interest in tamoxifen, which  is most likely what her grandmother took (with no side effects or complications) for 5 years.  However at this point Nichole Arnold is not quite ready to make a decision. The prescription accordingly was not called in. She will let us know if she would like to started and if she does I will schedule a return appointment for her approximately 3 months after starting the medication to make sure she tolerates it well.  Otherwise I have not made a return visit here for her, but we will be glad to see her again at any point in the future if and when the need arises.  Nichole Cruel, MD   01/12/2015 5:52 PM Medical Oncology and Hematology [facility] [address] [place], [place] [address] Tel. [phone]    Fax. [phone]

## 2015-02-14 ENCOUNTER — Ambulatory Visit (INDEPENDENT_AMBULATORY_CARE_PROVIDER_SITE_OTHER): Payer: Commercial Managed Care - HMO | Admitting: Family Medicine

## 2015-02-14 ENCOUNTER — Encounter: Payer: Self-pay | Admitting: Family Medicine

## 2015-02-14 ENCOUNTER — Telehealth: Payer: Self-pay

## 2015-02-14 VITALS — BP 110/76 | HR 82 | Temp 99.8°F | Ht 70.0 in | Wt 200.5 lb

## 2015-02-14 DIAGNOSIS — R509 Fever, unspecified: Secondary | ICD-10-CM | POA: Diagnosis not present

## 2015-02-14 DIAGNOSIS — J111 Influenza due to unidentified influenza virus with other respiratory manifestations: Secondary | ICD-10-CM

## 2015-02-14 LAB — POCT INFLUENZA A/B
INFLUENZA A, POC: POSITIVE — AB
Influenza B, POC: POSITIVE — AB

## 2015-02-14 MED ORDER — HYDROCOD POLST-CPM POLST ER 10-8 MG/5ML PO SUER: 5.0000 mL | Freq: Two times a day (BID) | ORAL | Status: DC | PRN

## 2015-02-14 MED ORDER — OSELTAMIVIR PHOSPHATE 75 MG PO CAPS: 75.0000 mg | ORAL_CAPSULE | Freq: Two times a day (BID) | ORAL | Status: DC

## 2015-02-14 NOTE — Telephone Encounter (Signed)
PLEASE NOTE: All timestamps contained within this report are represented as [place] Standard Time. CONFIDENTIALTY NOTICE: This fax transmission is intended only for the addressee. It contains information that is legally privileged, confidential or otherwise protected from use or disclosure. If you are not the intended recipient, you are strictly prohibited from reviewing, disclosing, copying using or disseminating any of this information or taking any action in reliance on or regarding this information. If you have received this fax in error, please notify us immediately by telephone so that we can arrange for its return to [place]. Phone: [phone], Toll-Free: [phone], Fax: [phone] Page: 1 of 2 Call Id: [id number] [place] Patient Name: Nichole Arnold Gender: Female DOB: 1965-10-18 Age: 50 Y 10 M 24 D Return Phone Number: [phone] (Primary) Address: City/State/Zip: [place] Client [facility] - Client Client Site [place] [facility], [place] Contact Type Call Call Type Triage / Clinical Relationship To Patient Self Appointment Disposition EMR Appointment Not Necessary Info pasted into Epic No Return Phone Number [phone] (Primary) Chief Complaint Flu Symptom Initial Comment Caller states has101 fever this a.m. Might have flu. Wanting to know if she can get Tamiflu called in or wait until tomorrow to be seen. No Triage Reason Patient declined Nurse Assessment Nurse: Einar Gip, RN, Neoma Laming Date/Time Eilene Ghazi Time): 02/13/2015 1:52:00 PM Confirm and document reason for call. If symptomatic, describe symptoms. You must click the next button to save text entered. ---Caller states she started with cold and cough on Friday. A little more cough yesterday. Woke up about 0200 and thought she had a fever but did not check until 0800  and it was 101. Some nausea. Temp now is 100. Caller is requesting Tamiflu. Advised we could not call in the medication and offered triage. Caller declines triage. Offered information for telehealth call and caller declines stating she will call office in am. Advised of 48 hour interval for Tamiflu. Caller verbalized understanding. Has the patient traveled out of the country within the last 30 days? ---Not Applicable Does the patient have any new or worsening symptoms? ---Yes Will a triage be completed? ---No Select reason for no triage. ---Patient declined Guidelines Guideline Title Affirmed Question Affirmed Notes Nurse Date/Time (Eastern Time) Disp. Time Eilene Ghazi Time) Disposition Final User 02/13/2015 1:57:22 PM Clinical Call Yes Einar Gip, RN, Neoma Laming After Care Instructions Given Call Event Type User Date / Time Description PLEASE NOTE: All timestamps contained within this report are represented as [place] Standard Time. CONFIDENTIALTY NOTICE: This fax transmission is intended only for the addressee. It contains information that is legally privileged, confidential or otherwise protected from use or disclosure. If you are not the intended recipient, you are strictly prohibited from reviewing, disclosing, copying using or disseminating any of this information or taking any action in reliance on or regarding this information. If you have received this fax in error, please notify us immediately by telephone so that we can arrange for its return to [place]. Phone: [phone], Toll-Free: [phone], Fax: [phone] Page: 2 of 2 Call Id: [id number]

## 2015-02-14 NOTE — Telephone Encounter (Signed)
I will see her then  

## 2015-02-14 NOTE — Progress Notes (Signed)
Subjective:    Patient ID: Nichole Arnold, female    DOB: 1965/12/11, 50 y.o.   MRN: [id number]  HPI Here for uri symptoms   Cough sat nt Fever - early Sunday am -- has been up to 102  achey and chilled  Thinks it may be the flu   Headache Dry heaves  Cough is mostly dry and hacky- a little mucous occ Not a lot of nasal congestion  Sneezes a bit   Sleeps a lot  Sore throat from cough  Taking mucinex  Day/night version of nyquil  advil   Patient Active Problem List   Diagnosis Date Noted  . Atypical ductal hyperplasia of right breast 01/12/2015  . Breast cancer screening, high risk patient 01/12/2015  . Mass of right breast on mammogram 11/05/2014  . Vertigo 02/01/2014  . Family history of stomach cancer 08/11/2012  . Anxiety disorder 08/11/2012  . Routine general medical examination at a health care facility 08/05/2012  . Unspecified family circumstance 11/09/2010  . ALLERGIC RHINITIS 08/05/2006  . Asthma 08/05/2006  . SKIN CANCER, HX OF 08/05/2006   Past Medical History  Diagnosis Date  . Allergic rhinitis   . Asthma   . Basal cell carcinoma of forehead   . Basal cell carcinoma of ear     Left  . Benign paroxysmal positional vertigo   . Anxiety    Past Surgical History  Procedure Laterality Date  . Cervical cone biopsy    . Melanoma excision      pre melanoma  . Cesarean section  12/00  . Neuroma surgery  6/02    finger  . Basal cell carcinoma excision  9/08    left ear  . Radioactive seed guided excisional breast biopsy Right 12/08/2014    Procedure: RADIOACTIVE SEED GUIDED EXCISIONAL BREAST BIOPSY;  Surgeon: Rolm Bookbinder, MD;  Location: [facility];  Service: General;  Laterality: Right;   Social History  Substance Use Topics  . Smoking status: Never Smoker   . Smokeless tobacco: None  . Alcohol Use: 0.0 oz/week    0 Standard drinks or equivalent per week     Comment: Occasional/ Social   Family History  Problem  Relation Age of Onset  . Asthma Mother   . Heart failure      GM  . Stomach cancer      GF  . Breast cancer Maternal Grandmother 37  . Uterine cancer Maternal Grandmother    No Known Allergies Current Outpatient Prescriptions on File Prior to Visit  Medication Sig Dispense Refill  . ALPRAZolam (XANAX) 0.25 MG tablet Take 1 tablet (0.25 mg total) by mouth daily as needed for sleep. 20 tablet 0  . cetirizine (ZYRTEC) 10 MG tablet Take 10 mg by mouth daily as needed.     . Fluticasone-Salmeterol (ADVAIR DISKUS) 100-50 MCG/DOSE AEPB INHALE ONE (1) PUFF EVERY 12 HOURS AS DIRECTED. RINSE MOUTH AFTER EACH USE. 60 each 11  . ibuprofen (ADVIL,MOTRIN) 200 MG tablet Take 800 mg by mouth every 6 (six) hours as needed for pain. Reported on 01/12/2015     No current facility-administered medications on file prior to visit.      Review of Systems  Constitutional: Positive for fever, chills, appetite change and fatigue.  HENT: Positive for congestion, postnasal drip, rhinorrhea, sinus pressure, sneezing and sore throat. Negative for ear pain.   Eyes: Negative for pain and discharge.  Respiratory: Positive for cough. Negative for shortness of breath, wheezing and  stridor.   Cardiovascular: Negative for chest pain.  Gastrointestinal: Negative for nausea, vomiting and diarrhea.  Genitourinary: Negative for urgency, frequency and hematuria.  Musculoskeletal: Negative for myalgias and arthralgias.  Skin: Negative for rash.  Neurological: Positive for headaches. Negative for dizziness, weakness and light-headedness.  Psychiatric/Behavioral: Negative for confusion and dysphoric mood.   .         Objective:   Physical Exam  Constitutional: She appears well-developed and well-nourished. No distress.  Fatigued appearing   HENT:  Head: Normocephalic and atraumatic.  Right Ear: External ear normal.  Left Ear: External ear normal.  Mouth/Throat: Oropharynx is clear and moist.  Nares are injected and  congested  No sinus tenderness Clear rhinorrhea and post nasal drip   Eyes: Conjunctivae and EOM are normal. Pupils are equal, round, and reactive to light. Right eye exhibits no discharge. Left eye exhibits no discharge.  Neck: Normal range of motion. Neck supple.  Cardiovascular: Normal rate and normal heart sounds.   Pulmonary/Chest: Effort normal and breath sounds normal. No respiratory distress. She has no wheezes. She has no rales. She exhibits no tenderness.  Loud harsh cough  Lymphadenopathy:    She has no cervical adenopathy.  Neurological: She is alert.  Skin: Skin is warm and dry. No rash noted.  Psychiatric: She has a normal mood and affect.          Assessment & Plan:   Problem List Items Addressed This Visit      Other   Influenza with respiratory manifestation    Pos flu swab/screen  Disc symptomatic care - see instructions on AVS  tamiflu px 75 mg bid for 5 d  tussionex prn cough with caution of sedation Update if not starting to improve in a week or if worsening           Other Visit Diagnoses    Fever, unspecified    -  Primary    Relevant Orders    POCT Influenza A/B (Completed)

## 2015-02-14 NOTE — Patient Instructions (Signed)
You have influenza  Drink fluids and rest  Take tamiflu as directed Take tussionex as directed as needed for cough with caution of sedation  mucinex for congestion as needed  Acetaminophen for fever/ also ibuprofen for fever - these may be mixed in other combination medications - watch your dosing  Acetaminophen is every 4 hours Ibuprofen is every 6   Update if not starting to improve in a week or if worsening

## 2015-02-14 NOTE — Assessment & Plan Note (Signed)
Pos flu swab/screen  Disc symptomatic care - see instructions on AVS  tamiflu px 75 mg bid for 5 d  tussionex prn cough with caution of sedation Update if not starting to improve in a week or if worsening

## 2015-02-14 NOTE — Progress Notes (Signed)
Pre visit review using our clinic review tool, if applicable. No additional management support is needed unless otherwise documented below in the visit note. 

## 2015-02-14 NOTE — Telephone Encounter (Signed)
Pt has appt 02/14/15 at 2:15 with Dr Glori Bickers.

## 2015-02-25 ENCOUNTER — Other Ambulatory Visit: Payer: Self-pay

## 2015-02-25 MED ORDER — HYDROCOD POLST-CPM POLST ER 10-8 MG/5ML PO SUER: 5.0000 mL | Freq: Two times a day (BID) | ORAL | Status: DC | PRN

## 2015-02-25 NOTE — Telephone Encounter (Signed)
Pt left v/m requesting rx tussionex. Call when ready for pick up. Pt last seen and rx printed # 120 ml on 02/14/15.

## 2015-02-25 NOTE — Telephone Encounter (Signed)
Did not get this until after 5 -she will have to pick it up Monday I will put it in IN box

## 2015-02-25 NOTE — Telephone Encounter (Signed)
Patient contacted and will come to the side of the building and ring doorbell to pick up Rx while Lugene is still at the office.

## 2015-03-09 ENCOUNTER — Other Ambulatory Visit: Payer: Self-pay

## 2015-03-09 MED ORDER — FLUTICASONE-SALMETEROL 100-50 MCG/DOSE IN AEPB: INHALATION_SPRAY | RESPIRATORY_TRACT | Status: DC

## 2015-03-09 MED ORDER — HYDROCOD POLST-CPM POLST ER 10-8 MG/5ML PO SUER: 5.0000 mL | Freq: Two times a day (BID) | ORAL | Status: DC | PRN

## 2015-03-09 NOTE — Telephone Encounter (Signed)
Pt said cough isn't to bad, it's not as productive as it use to be but she is having a hard time sleeping since she ran out of med because the cough keeps her up, pt notified Rx ready for pick-up and advise if sxs worsen or she develops any new sxs to schedule a f/u appt. Pt verbalized understanding

## 2015-03-09 NOTE — Telephone Encounter (Signed)
Pt left v/m requesting rx tussionex; pt still coughing at night time. Call when ready for pick up. Last printed # 120 ml on 02/25/15; pt last seen 02/14/15.

## 2015-03-09 NOTE — Telephone Encounter (Signed)
How severe is the cough? If worse or more productive or fever she needs to be seen  Px printed for pick up in IN box

## 2015-09-13 ENCOUNTER — Encounter: Payer: Self-pay | Admitting: Family Medicine

## 2015-09-13 ENCOUNTER — Ambulatory Visit (INDEPENDENT_AMBULATORY_CARE_PROVIDER_SITE_OTHER): Payer: Commercial Managed Care - HMO | Admitting: Family Medicine

## 2015-09-13 ENCOUNTER — Encounter: Payer: Self-pay | Admitting: Gastroenterology

## 2015-09-13 VITALS — BP 118/64 | HR 70 | Temp 98.4°F | Ht 70.0 in | Wt 205.5 lb

## 2015-09-13 DIAGNOSIS — Z800 Family history of malignant neoplasm of digestive organs: Secondary | ICD-10-CM | POA: Diagnosis not present

## 2015-09-13 DIAGNOSIS — R1031 Right lower quadrant pain: Secondary | ICD-10-CM | POA: Diagnosis not present

## 2015-09-13 DIAGNOSIS — G8929 Other chronic pain: Secondary | ICD-10-CM | POA: Diagnosis not present

## 2015-09-13 DIAGNOSIS — F410 Panic disorder [episodic paroxysmal anxiety] without agoraphobia: Secondary | ICD-10-CM

## 2015-09-13 NOTE — Assessment & Plan Note (Signed)
This is intermittent Pt would like to d/w GI along with screening for GI cancer in light of family hx

## 2015-09-13 NOTE — Progress Notes (Signed)
Subjective:    Patient ID: Nichole Arnold, female    DOB: 1965/03/07, 50 y.o.   MRN: [id number]  HPI Here for stress/ anxiety issues and panic attacks  She left her husband in May- a very good thing (much happier overall)   Taking care of her 49 yo daughter  There will not likely be custody issues   Stressors- rental house is a [employer] from a [employer] ([employer])  Smaller house  Some financial issues - dealing with company with her husband    has hx of panic attacks for a while  They happen out of the blue - feels lightheaded and cold sweat and has to take deep breaths  Lasts 1-4 minutes Feels very tired afterwards  She has had 2 since she moved out  She tries to take deep breaths and talks herself through it   She takes xanax only on days of very high stress-not related to panic disorder She used to see a counselor-would like to go again (unsure if her last counselor would take [place]) Has taken 27 of them since May  Does not take them often at all   She is enjoying things and making plans   Mother and grandfather had stomach cancer  She needs an appt with GI  Has intermittent dull pain in RLQ  Patient Active Problem List   Diagnosis Date Noted  . Abdominal pain, chronic, right lower quadrant 09/13/2015  . Influenza with respiratory manifestation 02/14/2015  . Atypical ductal hyperplasia of right breast 01/12/2015  . Breast cancer screening, high risk patient 01/12/2015  . Mass of right breast on mammogram 11/05/2014  . Vertigo 02/01/2014  . Family history- stomach cancer 08/11/2012  . Anxiety disorder 08/11/2012  . Routine general medical examination at a health care facility 08/05/2012  . Unspecified family circumstance 11/09/2010  . ALLERGIC RHINITIS 08/05/2006  . Asthma 08/05/2006  . SKIN CANCER, HX OF 08/05/2006   Past Medical History:  Diagnosis Date  . Allergic rhinitis   . Anxiety   . Asthma   . Basal cell carcinoma of ear    Left  . Basal cell  carcinoma of forehead   . Benign paroxysmal positional vertigo    Past Surgical History:  Procedure Laterality Date  . BASAL CELL CARCINOMA EXCISION  9/08   left ear  . CERVICAL CONE BIOPSY    . CESAREAN SECTION  12/00  . MELANOMA EXCISION     pre melanoma  . NEUROMA SURGERY  6/02   finger  . RADIOACTIVE SEED GUIDED EXCISIONAL BREAST BIOPSY Right 12/08/2014   Procedure: RADIOACTIVE SEED GUIDED EXCISIONAL BREAST BIOPSY;  Surgeon: Rolm Bookbinder, MD;  Location: [facility];  Service: General;  Laterality: Right;   Social History  Substance Use Topics  . Smoking status: Never Smoker  . Smokeless tobacco: Never Used  . Alcohol use 0.0 oz/week     Comment: Occasional/ Social   Family History  Problem Relation Age of Onset  . Asthma Mother   . Heart failure      GM  . Stomach cancer      GF  . Breast cancer Maternal Grandmother 36  . Uterine cancer Maternal Grandmother    No Known Allergies Current Outpatient Prescriptions on File Prior to Visit  Medication Sig Dispense Refill  . ALPRAZolam (XANAX) 0.25 MG tablet Take 1 tablet (0.25 mg total) by mouth daily as needed for sleep. 20 tablet 0  . cetirizine (ZYRTEC) 10 MG tablet Take  10 mg by mouth daily as needed.     . Fluticasone-Salmeterol (ADVAIR DISKUS) 100-50 MCG/DOSE AEPB INHALE ONE (1) PUFF EVERY 12 HOURS AS DIRECTED. RINSE MOUTH AFTER EACH USE. 60 each 5  . ibuprofen (ADVIL,MOTRIN) 200 MG tablet Take 800 mg by mouth every 6 (six) hours as needed for pain. Reported on 01/12/2015     No current facility-administered medications on file prior to visit.      Review of Systems    Review of Systems  Constitutional: Negative for fever, appetite change, fatigue and unexpected weight change.  Eyes: Negative for pain and visual disturbance.  Respiratory: Negative for cough and shortness of breath.   Cardiovascular: Negative for cp or palpitations    Gastrointestinal: Negative for nausea, diarrhea and  constipation.  Genitourinary: Negative for urgency and frequency.  Skin: Negative for pallor or rash   Neurological: Negative for weakness, light-headedness, numbness and headaches.  Hematological: Negative for adenopathy. Does not bruise/bleed easily.  Psychiatric/Behavioral: Negative for dysphoric mood. The patient is often anxious  Neg for SI or feeling of hopelessness.      Objective:    Physical Exam  Constitutional: She appears well-developed and well-nourished. No distress.  Well appearing   HENT:  Head: Normocephalic and atraumatic.  Mouth/Throat: Oropharynx is clear and moist.  Eyes: Conjunctivae and EOM are normal. Pupils are equal, round, and reactive to light.  Neck: Normal range of motion. Neck supple. No JVD present. Carotid bruit is not present. No thyromegaly present.  Cardiovascular: Normal rate, regular rhythm, normal heart sounds and intact distal pulses.  Exam reveals no gallop.   Pulmonary/Chest: Effort normal and breath sounds normal. No respiratory distress. She has no wheezes. She has no rales.  No crackles  Abdominal: Soft. Bowel sounds are normal. She exhibits no distension, no abdominal bruit and no mass. There is no hepatosplenomegaly. There is tenderness in the right lower quadrant. There is no rigidity, no rebound, no guarding, no CVA tenderness, no tenderness at McBurney's point and negative Murphy's sign.  Musculoskeletal: She exhibits no edema.  Lymphadenopathy:    She has no cervical adenopathy.  Neurological: She is alert. She has normal reflexes.  Skin: Skin is warm and dry. No rash noted.  Psychiatric: Her speech is normal and behavior is normal. Thought content normal. Her mood appears anxious. Her affect is not blunt, not labile and not inappropriate. Thought content is not paranoid. Cognition and memory are normal. She does not exhibit a depressed mood. She expresses no homicidal and no suicidal ideation.  Mood is fair overall  Talkative with good  insight  Disc stressors easily  Attentive           Assessment & Plan:   Problem List Items Addressed This Visit      Other   Family history- stomach cancer    Ref to GI to discuss screening for GI cancers       Relevant Orders   Ambulatory referral to Gastroenterology   Anxiety disorder - Primary    Anxiety along with some panic attacks Wants to avoid ssri for now Uses xanax infrequently Reviewed stressors/ coping techniques/symptoms/ support sources/ tx options and side effects in detail today Ref to counselor  >25 minutes spent in face to face time with patient, >50% spent in counselling or coordination of care Will update if symptoms worsen        Relevant Orders   Ambulatory referral to Psychology   Abdominal pain, chronic, right lower quadrant  This is intermittent Pt would like to d/w GI along with screening for GI cancer in light of family hx       Relevant Orders   Ambulatory referral to Gastroenterology    Other Visit Diagnoses   None.

## 2015-09-13 NOTE — Progress Notes (Signed)
Pre visit review using our clinic review tool, if applicable. No additional management support is needed unless otherwise documented below in the visit note. 

## 2015-09-13 NOTE — Assessment & Plan Note (Signed)
Ref to GI to discuss screening for GI cancers

## 2015-09-13 NOTE — Patient Instructions (Signed)
Please stop at check out for referral to psychology and GI  Take care of yourself  Don't forget to exercise -this helps mood and anxiety

## 2015-09-13 NOTE — Assessment & Plan Note (Signed)
Anxiety along with some panic attacks Wants to avoid ssri for now Uses xanax infrequently Reviewed stressors/ coping techniques/symptoms/ support sources/ tx options and side effects in detail today Ref to counselor  >25 minutes spent in face to face time with patient, >50% spent in counselling or coordination of care Will update if symptoms worsen

## 2015-10-10 LAB — HM MAMMOGRAPHY: HM Mammogram: NORMAL (ref 0–4)

## 2015-10-10 LAB — HM PAP SMEAR

## 2015-10-25 ENCOUNTER — Ambulatory Visit: Payer: Commercial Managed Care - HMO | Admitting: Gastroenterology

## 2015-10-27 ENCOUNTER — Ambulatory Visit: Payer: 59 | Admitting: Psychology

## 2015-12-14 ENCOUNTER — Ambulatory Visit (INDEPENDENT_AMBULATORY_CARE_PROVIDER_SITE_OTHER): Payer: Commercial Managed Care - HMO | Admitting: Family Medicine

## 2015-12-14 ENCOUNTER — Encounter: Payer: Self-pay | Admitting: Family Medicine

## 2015-12-14 VITALS — BP 110/70 | HR 65 | Temp 98.7°F | Wt 211.4 lb

## 2015-12-14 DIAGNOSIS — B9789 Other viral agents as the cause of diseases classified elsewhere: Secondary | ICD-10-CM | POA: Diagnosis not present

## 2015-12-14 DIAGNOSIS — J069 Acute upper respiratory infection, unspecified: Secondary | ICD-10-CM | POA: Diagnosis not present

## 2015-12-14 MED ORDER — HYDROCOD POLST-CPM POLST ER 10-8 MG/5ML PO SUER: 5.0000 mL | Freq: Two times a day (BID) | ORAL | 0 refills | Status: DC | PRN

## 2015-12-14 MED ORDER — PREDNISONE 20 MG PO TABS: ORAL_TABLET | ORAL | 0 refills | Status: DC

## 2015-12-14 MED ORDER — ALBUTEROL SULFATE HFA 108 (90 BASE) MCG/ACT IN AERS: 2.0000 | INHALATION_SPRAY | RESPIRATORY_TRACT | 1 refills | Status: DC | PRN

## 2015-12-14 NOTE — Progress Notes (Signed)
Subjective:    Patient ID: Nichole Arnold, female    DOB: March 08, 1965, 50 y.o.   MRN: [id number]  HPI This is a 50 yo female who presents today with cough for 5 days. History of asthma, restarted Advair about 2 weeks ago when her daughter started to have cold symtpoms. Does not have albuterol inhaler. Cough productive today, unsure of sputum. Coughing through night. No ear pain, some sore throat with coughing. No sinus pressure or congestion. No fever. Fatigued. Taking Mucinex DM with some relief. No wheezing or SOB. Requests some cough syrup for sleep.  Separated from husband 5/17. Has been eating out more, weight up a little. Has had barriers to exercise. Had basal cell carcinoma removed from upper back yesterday. No problems.   Past Medical History:  Diagnosis Date  . Allergic rhinitis   . Anxiety   . Asthma   . Basal cell carcinoma of ear    Left  . Basal cell carcinoma of forehead   . Benign paroxysmal positional vertigo    Past Surgical History:  Procedure Laterality Date  . BASAL CELL CARCINOMA EXCISION  9/08   left ear  . CERVICAL CONE BIOPSY    . CESAREAN SECTION  12/00  . MELANOMA EXCISION     pre melanoma  . NEUROMA SURGERY  6/02   finger  . RADIOACTIVE SEED GUIDED EXCISIONAL BREAST BIOPSY Right 12/08/2014   Procedure: RADIOACTIVE SEED GUIDED EXCISIONAL BREAST BIOPSY;  Surgeon: Rolm Bookbinder, MD;  Location: [facility];  Service: General;  Laterality: Right;   Family History  Problem Relation Age of Onset  . Asthma Mother   . Heart failure      GM  . Stomach cancer      GF  . Breast cancer Maternal Grandmother 63  . Uterine cancer Maternal Grandmother    Social History  Substance Use Topics  . Smoking status: Never Smoker  . Smokeless tobacco: Never Used  . Alcohol use 0.0 oz/week     Comment: Occasional/ Social      Review of Systems Per HPI    Objective:   Physical Exam  Constitutional: She is oriented to person, place, and  time. She appears well-developed and well-nourished.  HENT:  Head: Normocephalic and atraumatic.  Right Ear: Tympanic membrane, external ear and ear canal normal.  Left Ear: Tympanic membrane, external ear and ear canal normal.  Nose: Nose normal.  Mouth/Throat: Oropharynx is clear and moist. No oropharyngeal exudate.  Eyes: Conjunctivae are normal.  Neck: Normal range of motion. Neck supple.  Cardiovascular: Normal rate, regular rhythm and normal heart sounds.   Pulmonary/Chest: Effort normal and breath sounds normal.  Tight sounding non productive cough.   Musculoskeletal: Normal range of motion.  Lymphadenopathy:    She has no cervical adenopathy.  Neurological: She is alert and oriented to person, place, and time.  Skin: Skin is warm and dry. She is not diaphoretic.  Dry bandage intact on upper, mid back.   Psychiatric: She has a normal mood and affect. Her behavior is normal. Judgment and thought content normal.  Vitals reviewed.     BP 110/70   Pulse 65   Temp 98.7 F (37.1 C)   Wt 211 lb 6.4 oz (95.9 kg)   SpO2 98%   BMI 30.33 kg/m  Wt Readings from Last 3 Encounters:  12/14/15 211 lb 6.4 oz (95.9 kg)  09/13/15 205 lb 8 oz (93.2 kg)  02/14/15 200 lb 8 oz (90.9 kg)  Assessment & Plan:  1. Viral URI with cough - reviewed inhaler medications for asthma- Advair for daily, long term use, albuterol for immediate relief of symptoms -Provided written and verbal information regarding diagnosis and treatment. - can start prednisone if not better in 3-4 days with tussionex and albuterol - chlorpheniramine-HYDROcodone (TUSSIONEX PENNKINETIC ER) 10-8 MG/5ML SUER; Take 5 mLs by mouth every 12 (twelve) hours as needed for cough.  Dispense: 115 mL; Refill: 0 - albuterol (PROVENTIL HFA;VENTOLIN HFA) 108 (90 Base) MCG/ACT inhaler; Inhale 2 puffs into the lungs every 4 (four) hours as needed for wheezing or shortness of breath (cough, shortness of breath or wheezing.).   Dispense: 1 Inhaler; Refill: 1 - predniSONE (DELTASONE) 20 MG tablet; Take 2 tablets x 3 days then 1 tablet x 3 days  Dispense: 9 tablet; Refill: 0   Clarene Reamer, FNP-BC  [facility]  12/14/2015 10:23 AM

## 2015-12-14 NOTE — Patient Instructions (Signed)
If not better in 3-4 days, can start prednisone  Upper Respiratory Infection, Adult Most upper respiratory infections (URIs) are a viral infection of the air passages leading to the lungs. A URI affects the nose, throat, and upper air passages. The most common type of URI is nasopharyngitis and is typically referred to as "the common cold." URIs run their course and usually go away on their own. Most of the time, a URI does not require medical attention, but sometimes a bacterial infection in the upper airways can follow a viral infection. This is called a secondary infection. Sinus and middle ear infections are common types of secondary upper respiratory infections. Bacterial pneumonia can also complicate a URI. A URI can worsen asthma and chronic obstructive pulmonary disease (COPD). Sometimes, these complications can require emergency medical care and may be life threatening. What are the causes? Almost all URIs are caused by viruses. A virus is a type of germ and can spread from one person to another. What increases the risk? You may be at risk for a URI if:  You smoke.  You have chronic heart or lung disease.  You have a weakened defense (immune) system.  You are very young or very old.  You have nasal allergies or asthma.  You work in crowded or poorly ventilated areas.  You work in health care facilities or schools. What are the signs or symptoms? Symptoms typically develop 2-3 days after you come in contact with a cold virus. Most viral URIs last 7-10 days. However, viral URIs from the influenza virus (flu virus) can last 14-18 days and are typically more severe. Symptoms may include:  Runny or stuffy (congested) nose.  Sneezing.  Cough.  Sore throat.  Headache.  Fatigue.  Fever.  Loss of appetite.  Pain in your forehead, behind your eyes, and over your cheekbones (sinus pain).  Muscle aches. How is this diagnosed? Your health care provider may diagnose a URI  by:  Physical exam.  Tests to check that your symptoms are not due to another condition such as:  Strep throat.  Sinusitis.  Pneumonia.  Asthma. How is this treated? A URI goes away on its own with time. It cannot be cured with medicines, but medicines may be prescribed or recommended to relieve symptoms. Medicines may help:  Reduce your fever.  Reduce your cough.  Relieve nasal congestion. Follow these instructions at home:  Take medicines only as directed by your health care provider.  Gargle warm saltwater or take cough drops to comfort your throat as directed by your health care provider.  Use a warm mist humidifier or inhale steam from a shower to increase air moisture. This may make it easier to breathe.  Drink enough fluid to keep your urine clear or pale yellow.  Eat soups and other clear broths and maintain good nutrition.  Rest as needed.  Return to work when your temperature has returned to normal or as your health care provider advises. You may need to stay home longer to avoid infecting others. You can also use a face mask and careful hand washing to prevent spread of the virus.  Increase the usage of your inhaler if you have asthma.  Do not use any tobacco products, including cigarettes, chewing tobacco, or electronic cigarettes. If you need help quitting, ask your health care provider. How is this prevented? The best way to protect yourself from getting a cold is to practice good hygiene.  Avoid oral or hand contact with people  with cold symptoms.  Wash your hands often if contact occurs. There is no clear evidence that vitamin C, vitamin E, echinacea, or exercise reduces the chance of developing a cold. However, it is always recommended to get plenty of rest, exercise, and practice good nutrition. Contact a health care provider if:  You are getting worse rather than better.  Your symptoms are not controlled by medicine.  You have chills.  You  have worsening shortness of breath.  You have brown or red mucus.  You have yellow or brown nasal discharge.  You have pain in your face, especially when you bend forward.  You have a fever.  You have swollen neck glands.  You have pain while swallowing.  You have white areas in the back of your throat. Get help right away if:  You have severe or persistent:  Headache.  Ear pain.  Sinus pain.  Chest pain.  You have chronic lung disease and any of the following:  Wheezing.  Prolonged cough.  Coughing up blood.  A change in your usual mucus.  You have a stiff neck.  You have changes in your:  Vision.  Hearing.  Thinking.  Mood. This information is not intended to replace advice given to you by your health care provider. Make sure you discuss any questions you have with your health care provider. Document Released: 06/20/2000 Document Revised: 08/28/2015 Document Reviewed: 04/01/2013 Elsevier Interactive Patient Education  2017 [employer].

## 2015-12-21 ENCOUNTER — Other Ambulatory Visit: Payer: Commercial Managed Care - HMO

## 2015-12-21 ENCOUNTER — Ambulatory Visit (INDEPENDENT_AMBULATORY_CARE_PROVIDER_SITE_OTHER): Payer: Commercial Managed Care - HMO | Admitting: Gastroenterology

## 2015-12-21 ENCOUNTER — Encounter: Payer: Self-pay | Admitting: Gastroenterology

## 2015-12-21 VITALS — BP 110/70 | HR 72 | Ht 70.0 in | Wt 212.4 lb

## 2015-12-21 DIAGNOSIS — Z800 Family history of malignant neoplasm of digestive organs: Secondary | ICD-10-CM

## 2015-12-21 DIAGNOSIS — Z1212 Encounter for screening for malignant neoplasm of rectum: Secondary | ICD-10-CM | POA: Diagnosis not present

## 2015-12-21 DIAGNOSIS — Z1211 Encounter for screening for malignant neoplasm of colon: Secondary | ICD-10-CM

## 2015-12-21 NOTE — Progress Notes (Signed)
History of Present Illness: This is a 50 year old female referred by Tower, Nichole Fanny, MD for the evaluation of CRC screening and a family history of stomach cancer. She has no gastrointestinal complaints and states her mother was diagnosed with stomach cancer in her 53s and her maternal grandfather also had stomach cancer. She is not certain the diagnosis of a gastric cancer or whether it was some intra-abdominal cancer. She states her sister had an upper endoscopy showing H. Pylori and she was treated. Denies weight loss, abdominal pain, constipation, diarrhea, change in stool caliber, melena, hematochezia, nausea, vomiting, dysphagia, reflux symptoms, chest pain.   No Known Allergies Outpatient Medications Prior to Visit  Medication Sig Dispense Refill  . albuterol (PROVENTIL HFA;VENTOLIN HFA) 108 (90 Base) MCG/ACT inhaler Inhale 2 puffs into the lungs every 4 (four) hours as needed for wheezing or shortness of breath (cough, shortness of breath or wheezing.). 1 Inhaler 1  . ALPRAZolam (XANAX) 0.25 MG tablet Take 1 tablet (0.25 mg total) by mouth daily as needed for sleep. 20 tablet 0  . cetirizine (ZYRTEC) 10 MG tablet Take 10 mg by mouth daily as needed.     . chlorpheniramine-HYDROcodone (TUSSIONEX PENNKINETIC ER) 10-8 MG/5ML SUER Take 5 mLs by mouth every 12 (twelve) hours as needed for cough. 115 mL 0  . Fluticasone-Salmeterol (ADVAIR DISKUS) 100-50 MCG/DOSE AEPB INHALE ONE (1) PUFF EVERY 12 HOURS AS DIRECTED. RINSE MOUTH AFTER EACH USE. 60 each 5  . ibuprofen (ADVIL,MOTRIN) 200 MG tablet Take 800 mg by mouth every 6 (six) hours as needed for pain. Reported on 01/12/2015    . predniSONE (DELTASONE) 20 MG tablet Take 2 tablets x 3 days then 1 tablet x 3 days 9 tablet 0   No facility-administered medications prior to visit.    Past Medical History:  Diagnosis Date  . Allergic rhinitis   . Anxiety   . Asthma   . Basal cell carcinoma of ear    Left  . Basal cell carcinoma of forehead     . Benign paroxysmal positional vertigo    Past Surgical History:  Procedure Laterality Date  . BASAL CELL CARCINOMA EXCISION  9/08   left ear  . CERVICAL CONE BIOPSY    . CESAREAN SECTION  12/00  . MELANOMA EXCISION     pre melanoma  . NEUROMA SURGERY  6/02   finger  . RADIOACTIVE SEED GUIDED EXCISIONAL BREAST BIOPSY Right 12/08/2014   Procedure: RADIOACTIVE SEED GUIDED EXCISIONAL BREAST BIOPSY;  Surgeon: Rolm Bookbinder, MD;  Location: [facility];  Service: General;  Laterality: Right;   Social History   Social History  . Marital status: Married    Spouse name: N/A  . Number of children: N/A  . Years of education: N/A   Occupational History  .  Other    [employer]   Social History Main Topics  . Smoking status: Never Smoker  . Smokeless tobacco: Never Used  . Alcohol use 0.0 oz/week     Comment: Occasional/ Social  . Drug use: No  . Sexual activity: Yes    Birth control/ protection: None   Other Topics Concern  . None   Social History Narrative   Married      Works at [employer]   Family History  Problem Relation Age of Onset  . Asthma Mother   . Stomach cancer Mother   . Heart failure      GM  . Stomach cancer  GF  . Breast cancer Maternal Grandmother 75  . Uterine cancer Maternal Grandmother   . Colon polyps Father   . Stomach cancer Maternal Grandfather       Review of Systems: Pertinent positive and negative review of systems were noted in the above HPI section. All other review of systems were otherwise negative.   Physical Exam: General: Well developed, well nourished, no acute distress Head: Normocephalic and atraumatic Eyes:  sclerae anicteric, EOMI Ears: Normal auditory acuity Mouth: No deformity or lesions Neck: Supple, no masses or thyromegaly Lungs: Clear throughout to auscultation Heart: Regular rate and rhythm; no murmurs, rubs or bruits Abdomen: Soft, non tender and non distended. No masses,  hepatosplenomegaly or hernias noted. Normal Bowel sounds Rectal: not done Musculoskeletal: Symmetrical with no gross deformities  Skin: No lesions on visible extremities Pulses:  Normal pulses noted Extremities: No clubbing, cyanosis, edema or deformities noted Neurological: Alert oriented x 4, grossly nonfocal Cervical Nodes:  No significant cervical adenopathy Inguinal Nodes: No significant inguinal adenopathy Psychological:  Alert and cooperative. Normal mood and affect  Assessment and Recommendations:  1. Family history of stomach cancer in mother and MGF. There is no standard guideline for screening EGD in this situation. Obtain stool for H. pylori antigen and treat if positive. If she develops any upper gastrointestinal complaints I would have a low threshold to perform EGD.  2. CRC screening, average risk. Colonoscopy at age 44 in 03/2016.  cc: Abner Greenspan, MD [address] [place], [place] [address]

## 2015-12-21 NOTE — Patient Instructions (Signed)
Your physician has requested that you go to the basement for the following lab work before leaving today: H. Pylori stool antigen.  Thank you for choosing me and [facility].  Pricilla Riffle. Dagoberto Ligas., MD., Mar[facility] Regal

## 2016-01-04 ENCOUNTER — Other Ambulatory Visit: Payer: Self-pay | Admitting: Family Medicine

## 2016-01-04 DIAGNOSIS — B9789 Other viral agents as the cause of diseases classified elsewhere: Principal | ICD-10-CM

## 2016-01-04 DIAGNOSIS — J069 Acute upper respiratory infection, unspecified: Secondary | ICD-10-CM

## 2016-01-05 NOTE — Telephone Encounter (Signed)
Called and spoke with patient, she is feeling better, continues to have some cough, has several days off and would like a little more cough syrup so she can rest through the night without coughing. Prescription printed and left at front. If she continue to have symptoms, she will come in for recheck.

## 2016-01-05 NOTE — Telephone Encounter (Signed)
Pt left v/m requesting status of tussionex rx. Pt request cb when can pick up rx. Pt last seen 12/14/15 and rx last printed # 115 ml on 12/14/15.

## 2016-01-20 ENCOUNTER — Encounter: Payer: Self-pay | Admitting: Family Medicine

## 2016-01-20 ENCOUNTER — Ambulatory Visit (INDEPENDENT_AMBULATORY_CARE_PROVIDER_SITE_OTHER): Payer: Commercial Managed Care - HMO | Admitting: Family Medicine

## 2016-01-20 VITALS — BP 122/70 | HR 74 | Temp 98.7°F | Ht 70.0 in | Wt 214.8 lb

## 2016-01-20 DIAGNOSIS — B9789 Other viral agents as the cause of diseases classified elsewhere: Secondary | ICD-10-CM

## 2016-01-20 DIAGNOSIS — J069 Acute upper respiratory infection, unspecified: Secondary | ICD-10-CM

## 2016-01-20 MED ORDER — HYDROCOD POLST-CPM POLST ER 10-8 MG/5ML PO SUER: ORAL | 0 refills | Status: DC

## 2016-01-20 MED ORDER — FLUTICASONE-SALMETEROL 100-50 MCG/DOSE IN AEPB: INHALATION_SPRAY | RESPIRATORY_TRACT | 5 refills | Status: DC

## 2016-01-20 NOTE — Assessment & Plan Note (Signed)
Suspect she caught a new viral uri -no indication of bacterial infection  Re assuring exam  Fluids/rest  tussionex refilled prn severe cough only  Expectorant prn nsaid prn  Disc symptomatic care - see instructions on AVS  Update if not starting to improve in a week or if worsening  -esp if wheezing (did refill advair and has albuterol)

## 2016-01-20 NOTE — Progress Notes (Signed)
Subjective:    Patient ID: Nichole Arnold, female    DOB: 1965/08/19, 51 y.o.   MRN: [id number]  HPI Here with uri symptoms   Thinks she has a sinus infection  Was here in early dec and was given tussionex/prednisone  Felt a little better -still not 100%  Then woke up worse wed am  Thinks it is another bad cold   Cough- prod of a little phlegm A lot of nasal congestion  No fever   Chest is a little tight in pm  Not a lot of wheezing  Using advair   Patient Active Problem List   Diagnosis Date Noted  . Abdominal pain, chronic, right lower quadrant 09/13/2015  . Influenza with respiratory manifestation 02/14/2015  . Atypical ductal hyperplasia of right breast 01/12/2015  . Breast cancer screening, high risk patient 01/12/2015  . Mass of right breast on mammogram 11/05/2014  . Vertigo 02/01/2014  . Viral URI with cough 02/02/2013  . Family history- stomach cancer 08/11/2012  . Anxiety disorder 08/11/2012  . Routine general medical examination at a health care facility 08/05/2012  . Unspecified family circumstance 11/09/2010  . ALLERGIC RHINITIS 08/05/2006  . Asthma 08/05/2006  . SKIN CANCER, HX OF 08/05/2006   Past Medical History:  Diagnosis Date  . Allergic rhinitis   . Anxiety   . Asthma   . Basal cell carcinoma of ear    Left  . Basal cell carcinoma of forehead   . Benign paroxysmal positional vertigo    Past Surgical History:  Procedure Laterality Date  . BASAL CELL CARCINOMA EXCISION  9/08   left ear  . CERVICAL CONE BIOPSY    . CESAREAN SECTION  12/00  . MELANOMA EXCISION     pre melanoma  . NEUROMA SURGERY  6/02   finger  . RADIOACTIVE SEED GUIDED EXCISIONAL BREAST BIOPSY Right 12/08/2014   Procedure: RADIOACTIVE SEED GUIDED EXCISIONAL BREAST BIOPSY;  Surgeon: Rolm Bookbinder, MD;  Location: [facility];  Service: General;  Laterality: Right;   Social History  Substance Use Topics  . Smoking status: Never Smoker  . Smokeless  tobacco: Never Used  . Alcohol use 0.0 oz/week     Comment: Occasional/ Social   Family History  Problem Relation Age of Onset  . Asthma Mother   . Stomach cancer Mother   . Heart failure      GM  . Stomach cancer      GF  . Breast cancer Maternal Grandmother 19  . Uterine cancer Maternal Grandmother   . Colon polyps Father   . Stomach cancer Maternal Grandfather    No Known Allergies Current Outpatient Prescriptions on File Prior to Visit  Medication Sig Dispense Refill  . albuterol (PROVENTIL HFA;VENTOLIN HFA) 108 (90 Base) MCG/ACT inhaler Inhale 2 puffs into the lungs every 4 (four) hours as needed for wheezing or shortness of breath (cough, shortness of breath or wheezing.). 1 Inhaler 1  . cetirizine (ZYRTEC) 10 MG tablet Take 10 mg by mouth daily as needed.     Marland Kitchen ibuprofen (ADVIL,MOTRIN) 200 MG tablet Take 800 mg by mouth every 6 (six) hours as needed for pain. Reported on 01/12/2015    . ALPRAZolam (XANAX) 0.25 MG tablet Take 1 tablet (0.25 mg total) by mouth daily as needed for sleep. (Patient not taking: Reported on 01/20/2016) 20 tablet 0   No current facility-administered medications on file prior to visit.     Review of Systems  Constitutional: Positive  for appetite change and fatigue. Negative for fever.  HENT: Positive for congestion, postnasal drip, rhinorrhea, sinus pressure, sneezing and sore throat. Negative for ear pain.   Eyes: Negative for pain and discharge.  Respiratory: Positive for cough. Negative for shortness of breath, wheezing and stridor.   Cardiovascular: Negative for chest pain.  Gastrointestinal: Negative for diarrhea, nausea and vomiting.  Genitourinary: Negative for frequency, hematuria and urgency.  Musculoskeletal: Negative for arthralgias and myalgias.  Skin: Negative for rash.  Neurological: Positive for headaches. Negative for dizziness, weakness and light-headedness.  Psychiatric/Behavioral: Negative for confusion and dysphoric mood.              Objective:   Physical Exam  Constitutional: She appears well-developed and well-nourished. No distress.  Well appearing  HENT:  Head: Normocephalic and atraumatic.  Right Ear: External ear normal.  Left Ear: External ear normal.  Mouth/Throat: Oropharynx is clear and moist.  Nares are injected and congested  No sinus tenderness Clear rhinorrhea and post nasal drip   Eyes: Conjunctivae and EOM are normal. Pupils are equal, round, and reactive to light. Right eye exhibits no discharge. Left eye exhibits no discharge.  Neck: Normal range of motion. Neck supple.  Cardiovascular: Normal rate and normal heart sounds.   Pulmonary/Chest: Effort normal and breath sounds normal. No respiratory distress. She has no wheezes. She has no rales. She exhibits no tenderness.  Harsh bs Good air exch No rales or rhonchi   Lymphadenopathy:    She has no cervical adenopathy.  Neurological: She is alert.  Skin: Skin is warm and dry. No rash noted.  Psychiatric: She has a normal mood and affect.          Assessment & Plan:   Problem List Items Addressed This Visit      Respiratory   Viral URI with cough    Suspect she caught a new viral uri -no indication of bacterial infection  Re assuring exam  Fluids/rest  tussionex refilled prn severe cough only  Expectorant prn nsaid prn  Disc symptomatic care - see instructions on AVS  Update if not starting to improve in a week or if worsening  -esp if wheezing (did refill advair and has albuterol)      Relevant Medications   chlorpheniramine-HYDROcodone (TUSSIONEX) 10-8 MG/5ML SUER

## 2016-01-20 NOTE — Progress Notes (Signed)
Pre visit review using our clinic review tool, if applicable. No additional management support is needed unless otherwise documented below in the visit note. 

## 2016-01-20 NOTE — Patient Instructions (Addendum)
Drink lots of fluids and rest  tussionex for severe cough if needed  Albuterol as needed for wheezing or tightness  If worse cough or wheezing/ let us know  Nasal saline spray for congestion or netti pot  Aleve for pain or fever as needed    Update if not starting to improve in a week or if worsening     Upper Respiratory Infection, Adult Most upper respiratory infections (URIs) are a viral infection of the air passages leading to the lungs. A URI affects the nose, throat, and upper air passages. The most common type of URI is nasopharyngitis and is typically referred to as "the common cold." URIs run their course and usually go away on their own. Most of the time, a URI does not require medical attention, but sometimes a bacterial infection in the upper airways can follow a viral infection. This is called a secondary infection. Sinus and middle ear infections are common types of secondary upper respiratory infections. Bacterial pneumonia can also complicate a URI. A URI can worsen asthma and chronic obstructive pulmonary disease (COPD). Sometimes, these complications can require emergency medical care and may be life threatening. What are the causes? Almost all URIs are caused by viruses. A virus is a type of germ and can spread from one person to another. What increases the risk? You may be at risk for a URI if:  You smoke.  You have chronic heart or lung disease.  You have a weakened defense (immune) system.  You are very young or very old.  You have nasal allergies or asthma.  You work in crowded or poorly ventilated areas.  You work in health care facilities or schools. What are the signs or symptoms? Symptoms typically develop 2-3 days after you come in contact with a cold virus. Most viral URIs last 7-10 days. However, viral URIs from the influenza virus (flu virus) can last 14-18 days and are typically more severe. Symptoms may include:  Runny or stuffy (congested)  nose.  Sneezing.  Cough.  Sore throat.  Headache.  Fatigue.  Fever.  Loss of appetite.  Pain in your forehead, behind your eyes, and over your cheekbones (sinus pain).  Muscle aches. How is this diagnosed? Your health care provider may diagnose a URI by:  Physical exam.  Tests to check that your symptoms are not due to another condition such as:  Strep throat.  Sinusitis.  Pneumonia.  Asthma. How is this treated? A URI goes away on its own with time. It cannot be cured with medicines, but medicines may be prescribed or recommended to relieve symptoms. Medicines may help:  Reduce your fever.  Reduce your cough.  Relieve nasal congestion. Follow these instructions at home:  Take medicines only as directed by your health care provider.  Gargle warm saltwater or take cough drops to comfort your throat as directed by your health care provider.  Use a warm mist humidifier or inhale steam from a shower to increase air moisture. This may make it easier to breathe.  Drink enough fluid to keep your urine clear or pale yellow.  Eat soups and other clear broths and maintain good nutrition.  Rest as needed.  Return to work when your temperature has returned to normal or as your health care provider advises. You may need to stay home longer to avoid infecting others. You can also use a face mask and careful hand washing to prevent spread of the virus.  Increase the usage of your  inhaler if you have asthma.  Do not use any tobacco products, including cigarettes, chewing tobacco, or electronic cigarettes. If you need help quitting, ask your health care provider. How is this prevented? The best way to protect yourself from getting a cold is to practice good hygiene.  Avoid oral or hand contact with people with cold symptoms.  Wash your hands often if contact occurs. There is no clear evidence that vitamin C, vitamin E, echinacea, or exercise reduces the chance of  developing a cold. However, it is always recommended to get plenty of rest, exercise, and practice good nutrition. Contact a health care provider if:  You are getting worse rather than better.  Your symptoms are not controlled by medicine.  You have chills.  You have worsening shortness of breath.  You have brown or red mucus.  You have yellow or brown nasal discharge.  You have pain in your face, especially when you bend forward.  You have a fever.  You have swollen neck glands.  You have pain while swallowing.  You have white areas in the back of your throat. Get help right away if:  You have severe or persistent:  Headache.  Ear pain.  Sinus pain.  Chest pain.  You have chronic lung disease and any of the following:  Wheezing.  Prolonged cough.  Coughing up blood.  A change in your usual mucus.  You have a stiff neck.  You have changes in your:  Vision.  Hearing.  Thinking.  Mood. This information is not intended to replace advice given to you by your health care provider. Make sure you discuss any questions you have with your health care provider. Document Released: 06/20/2000 Document Revised: 08/28/2015 Document Reviewed: 04/01/2013 Elsevier Interactive Patient Education  2017 [employer].

## 2016-02-05 ENCOUNTER — Telehealth: Payer: Self-pay | Admitting: Family Medicine

## 2016-02-05 DIAGNOSIS — Z0000 Encounter for general adult medical examination without abnormal findings: Secondary | ICD-10-CM

## 2016-02-05 NOTE — Telephone Encounter (Signed)
-----   Message from Marchia Bond sent at 02/03/2016  3:26 PM EST ----- Regarding: Cpx Tues 2/6, need orders. Thanks :-) Please order  future cpx labs for pt's upcoming lab appt. Thanks Aniceto Boss

## 2016-02-08 ENCOUNTER — Encounter: Payer: Self-pay | Admitting: Gastroenterology

## 2016-02-15 ENCOUNTER — Other Ambulatory Visit (INDEPENDENT_AMBULATORY_CARE_PROVIDER_SITE_OTHER): Payer: Commercial Managed Care - HMO

## 2016-02-15 DIAGNOSIS — Z0000 Encounter for general adult medical examination without abnormal findings: Secondary | ICD-10-CM | POA: Diagnosis not present

## 2016-02-15 LAB — CBC WITH DIFFERENTIAL/PLATELET
BASOS ABS: 0 10*3/uL (ref 0.0–0.1)
Basophils Relative: 0.5 % (ref 0.0–3.0)
EOS ABS: 0.2 10*3/uL (ref 0.0–0.7)
Eosinophils Relative: 2.5 % (ref 0.0–5.0)
HEMATOCRIT: 38.8 % (ref 36.0–46.0)
HEMOGLOBIN: 13 g/dL (ref 12.0–15.0)
LYMPHS PCT: 32.5 % (ref 12.0–46.0)
Lymphs Abs: 2.3 10*3/uL (ref 0.7–4.0)
MCHC: 33.4 g/dL (ref 30.0–36.0)
MCV: 94.8 fl (ref 78.0–100.0)
MONO ABS: 0.7 10*3/uL (ref 0.1–1.0)
Monocytes Relative: 10.3 % (ref 3.0–12.0)
NEUTROS ABS: 3.9 10*3/uL (ref 1.4–7.7)
Neutrophils Relative %: 54.2 % (ref 43.0–77.0)
PLATELETS: 348 10*3/uL (ref 150.0–400.0)
RBC: 4.09 Mil/uL (ref 3.87–5.11)
RDW: 12.7 % (ref 11.5–15.5)
WBC: 7.2 10*3/uL (ref 4.0–10.5)

## 2016-02-15 LAB — LIPID PANEL
CHOL/HDL RATIO: 3
Cholesterol: 254 mg/dL — ABNORMAL HIGH (ref 0–200)
HDL: 82.1 mg/dL (ref >39.00)
LDL CALC: 146 mg/dL — AB (ref 0–99)
NONHDL: 172.02
TRIGLYCERIDES: 131 mg/dL (ref 0.0–149.0)
VLDL: 26.2 mg/dL (ref 0.0–40.0)

## 2016-02-15 LAB — COMPREHENSIVE METABOLIC PANEL
ALT: 27 U/L (ref 0–35)
AST: 25 U/L (ref 0–37)
Albumin: 4.3 g/dL (ref 3.5–5.2)
Alkaline Phosphatase: 50 U/L (ref 39–117)
BILIRUBIN TOTAL: 0.3 mg/dL (ref 0.2–1.2)
BUN: 17 mg/dL (ref 6–23)
CALCIUM: 9.1 mg/dL (ref 8.4–10.5)
CHLORIDE: 104 meq/L (ref 96–112)
CO2: 29 meq/L (ref 19–32)
CREATININE: 0.82 mg/dL (ref 0.40–1.20)
GFR: 78.46 mL/min (ref >60.00)
Glucose, Bld: 96 mg/dL (ref 70–99)
Potassium: 4.1 mEq/L (ref 3.5–5.1)
SODIUM: 137 meq/L (ref 135–145)
Total Protein: 6.9 g/dL (ref 6.0–8.3)

## 2016-02-15 LAB — TSH: TSH: 2.17 u[IU]/mL (ref 0.35–4.50)

## 2016-02-29 ENCOUNTER — Encounter: Payer: Self-pay | Admitting: Family Medicine

## 2016-02-29 ENCOUNTER — Ambulatory Visit (INDEPENDENT_AMBULATORY_CARE_PROVIDER_SITE_OTHER): Payer: Commercial Managed Care - HMO | Admitting: Family Medicine

## 2016-02-29 VITALS — BP 120/66 | HR 68 | Temp 98.6°F | Ht 69.75 in | Wt 216.5 lb

## 2016-02-29 DIAGNOSIS — E7800 Pure hypercholesterolemia, unspecified: Secondary | ICD-10-CM

## 2016-02-29 DIAGNOSIS — N6091 Unspecified benign mammary dysplasia of right breast: Secondary | ICD-10-CM | POA: Diagnosis not present

## 2016-02-29 DIAGNOSIS — Z0000 Encounter for general adult medical examination without abnormal findings: Secondary | ICD-10-CM | POA: Diagnosis not present

## 2016-02-29 NOTE — Progress Notes (Signed)
Subjective:    Patient ID: Nichole Arnold, female    DOB: Mar 31, 1965, 51 y.o.   MRN: [id number]  HPI Here for health maintenance exam and to review chronic medical problems    Still coughing from a uri (worse with heat or fatigue)  Does not think she is wheezing  Uses advair regularly  Albuterol helps cough at times  tussionex only when abs necessary  Delsym prn  No fever   She also has a lot of stress - divorce will be final in the end of May  Is sleeping better  Wakes up at night occ with coughing fit   Not a lot of time for self care  Plays golf on the weekends    Wt Readings from Last 3 Encounters:  02/29/16 216 lb 8 oz (98.2 kg)  01/20/16 214 lb 12 oz (97.4 kg)  12/21/15 212 lb 6.4 oz (96.3 kg)  wt is up a bit  Not much appetite or time and not always eating well  Trying to pick some better choices - a lot of grilled chicken and spinach  Some red wine and popcorn  bmi 31.2   HIV screening -declines , because she donates blood and it is screened   Mammogram 10/17 s/p  biopsy. Atypical ductal hyperplasia (has inc chance of breast cancer), last mm was normal  Self breast exam -no lumps or changes   Hx of skin cancer - has appt next Tuesday for additional removal of basal cell lesion on back  Will have Moh's  She wears sun protection   Pap/gyn care -sees gyn  10/17 normal pap with Dr Radene Knee   Colon screening - due for first colonoscopy in march Has the letter from Dr Lynne Leader office     Flu vaccine 9/17 Tetanus vaccine 5/15 Tdap  Lab on 02/15/2016  Component Date Value Ref Range Status  . WBC 02/15/2016 7.2  4.0 - 10.5 K/uL Final  . RBC 02/15/2016 4.09  3.87 - 5.11 Mil/uL Final  . Hemoglobin 02/15/2016 13.0  12.0 - 15.0 g/dL Final  . HCT 02/15/2016 38.8  36.0 - 46.0 % Final  . MCV 02/15/2016 94.8  78.0 - 100.0 fl Final  . MCHC 02/15/2016 33.4  30.0 - 36.0 g/dL Final  . RDW 02/15/2016 12.7  11.5 - 15.5 % Final  . Platelets 02/15/2016 348.0  150.0 -  400.0 K/uL Final  . Neutrophils Relative % 02/15/2016 54.2  43.0 - 77.0 % Final  . Lymphocytes Relative 02/15/2016 32.5  12.0 - 46.0 % Final  . Monocytes Relative 02/15/2016 10.3  3.0 - 12.0 % Final  . Eosinophils Relative 02/15/2016 2.5  0.0 - 5.0 % Final  . Basophils Relative 02/15/2016 0.5  0.0 - 3.0 % Final  . Neutro Abs 02/15/2016 3.9  1.4 - 7.7 K/uL Final  . Lymphs Abs 02/15/2016 2.3  0.7 - 4.0 K/uL Final  . Monocytes Absolute 02/15/2016 0.7  0.1 - 1.0 K/uL Final  . Eosinophils Absolute 02/15/2016 0.2  0.0 - 0.7 K/uL Final  . Basophils Absolute 02/15/2016 0.0  0.0 - 0.1 K/uL Final  . Sodium 02/15/2016 137  135 - 145 mEq/L Final  . Potassium 02/15/2016 4.1  3.5 - 5.1 mEq/L Final  . Chloride 02/15/2016 104  96 - 112 mEq/L Final  . CO2 02/15/2016 29  19 - 32 mEq/L Final  . Glucose, Bld 02/15/2016 96  70 - 99 mg/dL Final  . BUN 02/15/2016 17  6 - 23 mg/dL Final  .  Creatinine, Ser 02/15/2016 0.82  0.40 - 1.20 mg/dL Final  . Total Bilirubin 02/15/2016 0.3  0.2 - 1.2 mg/dL Final  . Alkaline Phosphatase 02/15/2016 50  39 - 117 U/L Final  . AST 02/15/2016 25  0 - 37 U/L Final  . ALT 02/15/2016 27  0 - 35 U/L Final  . Total Protein 02/15/2016 6.9  6.0 - 8.3 g/dL Final  . Albumin 02/15/2016 4.3  3.5 - 5.2 g/dL Final  . Calcium 02/15/2016 9.1  8.4 - 10.5 mg/dL Final  . GFR 02/15/2016 78.46  >60.00 mL/min Final  . Cholesterol 02/15/2016 254* 0 - 200 mg/dL Final  . Triglycerides 02/15/2016 131.0  0.0 - 149.0 mg/dL Final  . HDL 02/15/2016 82.10  >39.00 mg/dL Final  . VLDL 02/15/2016 26.2  0.0 - 40.0 mg/dL Final  . LDL Cholesterol 02/15/2016 146* 0 - 99 mg/dL Final  . Total CHOL/HDL Ratio 02/15/2016 3   Final  . NonHDL 02/15/2016 172.02   Final  . TSH 02/15/2016 2.17  0.35 - 4.50 uIU/mL Final     HDL is 82.0 LDL is 146- up from 111/ from eating out ?    Patient Active Problem List   Diagnosis Date Noted  . Abdominal pain, chronic, right lower quadrant 09/13/2015  . Atypical ductal  hyperplasia of right breast 01/12/2015  . Breast cancer screening, high risk patient 01/12/2015  . Mass of right breast on mammogram 11/05/2014  . Vertigo 02/01/2014  . Family history- stomach cancer 08/11/2012  . Anxiety disorder 08/11/2012  . Routine general medical examination at a health care facility 08/05/2012  . Unspecified family circumstance 11/09/2010  . ALLERGIC RHINITIS 08/05/2006  . Asthma 08/05/2006  . SKIN CANCER, HX OF 08/05/2006   Past Medical History:  Diagnosis Date  . Allergic rhinitis   . Anxiety   . Asthma   . Basal cell carcinoma of ear    Left  . Basal cell carcinoma of forehead   . Benign paroxysmal positional vertigo    Past Surgical History:  Procedure Laterality Date  . BASAL CELL CARCINOMA EXCISION  9/08   left ear  . CERVICAL CONE BIOPSY    . CESAREAN SECTION  12/00  . MELANOMA EXCISION     pre melanoma  . NEUROMA SURGERY  6/02   finger  . RADIOACTIVE SEED GUIDED EXCISIONAL BREAST BIOPSY Right 12/08/2014   Procedure: RADIOACTIVE SEED GUIDED EXCISIONAL BREAST BIOPSY;  Surgeon: Rolm Bookbinder, MD;  Location: [facility];  Service: General;  Laterality: Right;   Social History  Substance Use Topics  . Smoking status: Never Smoker  . Smokeless tobacco: Never Used  . Alcohol use 0.0 oz/week     Comment: Occasional/ Social   Family History  Problem Relation Age of Onset  . Asthma Mother   . Stomach cancer Mother   . Heart failure      GM  . Stomach cancer      GF  . Breast cancer Maternal Grandmother 36  . Uterine cancer Maternal Grandmother   . Colon polyps Father   . Stomach cancer Maternal Grandfather    No Known Allergies Current Outpatient Prescriptions on File Prior to Visit  Medication Sig Dispense Refill  . albuterol (PROVENTIL HFA;VENTOLIN HFA) 108 (90 Base) MCG/ACT inhaler Inhale 2 puffs into the lungs every 4 (four) hours as needed for wheezing or shortness of breath (cough, shortness of breath or  wheezing.). 1 Inhaler 1  . ALPRAZolam (XANAX) 0.25 MG tablet Take 1 tablet (  0.25 mg total) by mouth daily as needed for sleep. 20 tablet 0  . cetirizine (ZYRTEC) 10 MG tablet Take 10 mg by mouth daily as needed.     . Fluticasone-Salmeterol (ADVAIR DISKUS) 100-50 MCG/DOSE AEPB INHALE ONE (1) PUFF EVERY 12 HOURS AS DIRECTED. RINSE MOUTH AFTER EACH USE. 60 each 5  . ibuprofen (ADVIL,MOTRIN) 200 MG tablet Take 800 mg by mouth every 6 (six) hours as needed for pain. Reported on 01/12/2015     No current facility-administered medications on file prior to visit.     Review of Systems Review of Systems  Constitutional: Negative for fever, appetite change, fatigue and unexpected weight change.  Eyes: Negative for pain and visual disturbance.  Respiratory: Negative for cough and shortness of breath.   Cardiovascular: Negative for cp or palpitations    Gastrointestinal: Negative for nausea, diarrhea and constipation.  Genitourinary: Negative for urgency and frequency.  Skin: Negative for pallor or rash   Neurological: Negative for weakness, light-headedness, numbness and headaches.  Hematological: Negative for adenopathy. Does not bruise/bleed easily.  Psychiatric/Behavioral: Negative for dysphoric mood. The patient is not nervous/anxious.  pos for stressors that should improve soon         Objective:   Physical Exam  Constitutional: She appears well-developed and well-nourished. No distress.  Well appearing and overwt   HENT:  Head: Normocephalic and atraumatic.  Right Ear: External ear normal.  Left Ear: External ear normal.  Nose: Nose normal.  Mouth/Throat: Oropharynx is clear and moist.  Eyes: Conjunctivae and EOM are normal. Pupils are equal, round, and reactive to light. Right eye exhibits no discharge. Left eye exhibits no discharge. No scleral icterus.  Neck: Normal range of motion. Neck supple. No JVD present. Carotid bruit is not present. No thyromegaly present.  Cardiovascular:  Normal rate, regular rhythm, normal heart sounds and intact distal pulses.  Exam reveals no gallop.   Pulmonary/Chest: Effort normal and breath sounds normal. No respiratory distress. She has no wheezes. She has no rales.  Good air exch No wheeze occ dry cough  Abdominal: Soft. Bowel sounds are normal. She exhibits no distension and no mass. There is no tenderness.  Genitourinary:  Genitourinary Comments: Goes to gyn office for exam  Musculoskeletal: She exhibits no edema or tenderness.  Lymphadenopathy:    She has no cervical adenopathy.  Neurological: She is alert. She has normal reflexes. No cranial nerve deficit. She exhibits normal muscle tone. Coordination normal.  Skin: Skin is warm and dry. No rash noted. No erythema. No pallor.  Solar lentigines diffusely  Scar on back from recent skin ca removal  Some rosacea   Psychiatric: She has a normal mood and affect.  Pleasant and talkative           Assessment & Plan:   Problem List Items Addressed This Visit      Other   Atypical ductal hyperplasia of right breast    Last mammogram was clear  bx was benign  She has inc risk for breast cancer Enc regular self exams and mammograms  Sees gyn      Hyperlipidemia    LDL up into the 140s Disc goals for lipids and reasons to control them Rev labs with pt Rev low sat fat diet in detail Will work on diet (which has been poor)- to get this under 100 if possible Continue to follow Handout given       Routine general medical examination at a health care facility - Primary  Reviewed health habits including diet and exercise and skin cancer prevention Reviewed appropriate screening tests for age  Also reviewed health mt list, fam hx and immunization status , as well as social and family history   See HPI  Enc wt loss and exercise and better diet Disc plan to get cholesterol down  utd mammo and gyn exam  utd derm f/u - with Moh's planned for a basal cell lesion  She will  plan her colonoscopy Vaccines utd Labs reviewed

## 2016-02-29 NOTE — Patient Instructions (Addendum)
Do schedule your colonoscopy   For cholesterol :   Avoid red meat/ fried foods/ egg yolks/ fatty breakfast meats/ butter, cheese and high fat dairy/ and shellfish    Get back to exercise when you are up for it   Fat and Cholesterol Restricted Diet High levels of fat and cholesterol in your blood may lead to various health problems, such as diseases of the heart, blood vessels, gallbladder, liver, and pancreas. Fats are concentrated sources of energy that come in various forms. Certain types of fat, including saturated fat, may be harmful in excess. Cholesterol is a substance needed by your body in small amounts. Your body makes all the cholesterol it needs. Excess cholesterol comes from the food you eat. When you have high levels of cholesterol and saturated fat in your blood, health problems can develop because the excess fat and cholesterol will gather along the walls of your blood vessels, causing them to narrow. Choosing the right foods will help you control your intake of fat and cholesterol. This will help keep the levels of these substances in your blood within normal limits and reduce your risk of disease. What is my plan? Your health care provider recommends that you:  Limit your fat intake to ______% or less of your total calories per day.  Limit the amount of cholesterol in your diet to less than _________mg per day.  Eat 20-30 grams of fiber each day. What types of fat should I choose?  Choose healthy fats more often. Choose monounsaturated and polyunsaturated fats, such as olive and canola oil, flaxseeds, walnuts, almonds, and seeds.  Eat more omega-3 fats. Good choices include salmon, mackerel, sardines, tuna, flaxseed oil, and ground flaxseeds. Aim to eat fish at least two times a week.  Limit saturated fats. Saturated fats are primarily found in animal products, such as meats, butter, and cream. Plant sources of saturated fats include palm oil, palm kernel oil, and coconut  oil.  Avoid foods with partially hydrogenated oils in them. These contain trans fats. Examples of foods that contain trans fats are stick margarine, some tub margarines, cookies, crackers, and other baked goods. What general guidelines do I need to follow? These guidelines for healthy eating will help you control your intake of fat and cholesterol:  Check food labels carefully to identify foods with trans fats or high amounts of saturated fat.  Fill one half of your plate with vegetables and green salads.  Fill one fourth of your plate with whole grains. Look for the word "whole" as the first word in the ingredient list.  Fill one fourth of your plate with lean protein foods.  Limit fruit to two servings a day. Choose fruit instead of juice.  Eat more foods that contain fiber, such as apples, broccoli, carrots, beans, peas, and barley.  Eat more home-cooked food and less restaurant, buffet, and fast food.  Limit or avoid alcohol.  Limit foods high in starch and sugar.  Limit fried foods.  Cook foods using methods other than frying. Baking, boiling, grilling, and broiling are all great options.  Lose weight if you are overweight. Losing just 5-10% of your initial body weight can help your overall health and prevent diseases such as diabetes and heart disease. What foods can I eat? Grains  Whole grains, such as whole wheat or whole grain breads, crackers, cereals, and pasta. Unsweetened oatmeal, bulgur, barley, quinoa, or brown rice. Corn or whole wheat flour tortillas. Vegetables  Fresh or frozen vegetables (raw, steamed,  roasted, or grilled). Green salads. Fruits  All fresh, canned (in natural juice), or frozen fruits. Meats and other protein foods  Ground beef (85% or leaner), grass-fed beef, or beef trimmed of fat. Skinless chicken or [place]. Ground chicken or [place]. Pork trimmed of fat. All fish and seafood. Eggs. Dried beans, peas, or lentils. Unsalted nuts or seeds.  Unsalted canned or dry beans. Dairy  Low-fat dairy products, such as skim or 1% milk, 2% or reduced-fat cheeses, low-fat ricotta or cottage cheese, or plain low-fat yo Fats and oils  Tub margarines without trans fats. Light or reduced-fat mayonnaise and salad dressings. Avocado. Olive, canola, sesame, or safflower oils. Natural peanut or almond butter (choose ones without added sugar and oil). The items listed above may not be a complete list of recommended foods or beverages. Contact your dietitian for more options.  Foods to avoid Grains  White bread. White pasta. White rice. Cornbread. Bagels, pastries, and croissants. Crackers that contain trans fat. Vegetables  White potatoes. Corn. Creamed or fried vegetables. Vegetables in a cheese sauce. Fruits  Dried fruits. Canned fruit in light or heavy syrup. Fruit juice. Meats and other protein foods  Fatty cuts of meat. Ribs, chicken wings, bacon, sausage, bologna, salami, chitterlings, fatback, hot dogs, bratwurst, and packaged luncheon meats. Liver and organ meats. Dairy  Whole or 2% milk, cream, half-and-half, and cream cheese. Whole milk cheeses. Whole-fat or sweetened yogurt. Full-fat cheeses. Nondairy creamers and whipped toppings. Processed cheese, cheese spreads, or cheese curds. Beverages  Alcohol. Sweetened drinks (such as sodas, lemonade, and fruit drinks or punches). Fats and oils  Butter, stick margarine, lard, shortening, ghee, or bacon fat. Coconut, palm kernel, or palm oils. Sweets and desserts  Corn syrup, sugars, honey, and molasses. Candy. Jam and jelly. Syrup. Sweetened cereals. Cookies, pies, cakes, donuts, muffins, and ice cream. The items listed above may not be a complete list of foods and beverages to avoid. Contact your dietitian for more information.  This information is not intended to replace advice given to you by your health care provider. Make sure you discuss any questions you have with your health care  provider. Document Released: 12/25/2004 Document Revised: 01/15/2014 Document Reviewed: 03/25/2013 Elsevier Interactive Patient Education  2017 [employer].

## 2016-03-01 DIAGNOSIS — E785 Hyperlipidemia, unspecified: Secondary | ICD-10-CM | POA: Insufficient documentation

## 2016-03-01 NOTE — Assessment & Plan Note (Signed)
LDL up into the 140s Disc goals for lipids and reasons to control them Rev labs with pt Rev low sat fat diet in detail Will work on diet (which has been poor)- to get this under 100 if possible Continue to follow Handout given

## 2016-03-01 NOTE — Assessment & Plan Note (Signed)
Last mammogram was clear  bx was benign  She has inc risk for breast cancer Enc regular self exams and mammograms  Sees gyn

## 2016-03-01 NOTE — Assessment & Plan Note (Signed)
Reviewed health habits including diet and exercise and skin cancer prevention Reviewed appropriate screening tests for age  Also reviewed health mt list, fam hx and immunization status , as well as social and family history   See HPI  Enc wt loss and exercise and better diet Disc plan to get cholesterol down  utd mammo and gyn exam  utd derm f/u - with Moh's planned for a basal cell lesion  She will plan her colonoscopy Vaccines utd Labs reviewed

## 2016-03-06 DIAGNOSIS — C44519 Basal cell carcinoma of skin of other part of trunk: Secondary | ICD-10-CM | POA: Diagnosis not present

## 2016-06-04 DIAGNOSIS — J029 Acute pharyngitis, unspecified: Secondary | ICD-10-CM | POA: Diagnosis not present

## 2016-10-09 DIAGNOSIS — Z86018 Personal history of other benign neoplasm: Secondary | ICD-10-CM | POA: Diagnosis not present

## 2016-10-09 DIAGNOSIS — D239 Other benign neoplasm of skin, unspecified: Secondary | ICD-10-CM | POA: Diagnosis not present

## 2016-10-09 DIAGNOSIS — L570 Actinic keratosis: Secondary | ICD-10-CM | POA: Diagnosis not present

## 2016-10-09 DIAGNOSIS — D225 Melanocytic nevi of trunk: Secondary | ICD-10-CM | POA: Diagnosis not present

## 2016-10-09 LAB — HM PAP SMEAR: HM Pap smear: NORMAL

## 2016-11-05 DIAGNOSIS — Z1231 Encounter for screening mammogram for malignant neoplasm of breast: Secondary | ICD-10-CM | POA: Diagnosis not present

## 2016-11-05 DIAGNOSIS — Z01419 Encounter for gynecological examination (general) (routine) without abnormal findings: Secondary | ICD-10-CM | POA: Diagnosis not present

## 2016-11-06 DIAGNOSIS — Z13820 Encounter for screening for osteoporosis: Secondary | ICD-10-CM | POA: Diagnosis not present

## 2016-11-08 ENCOUNTER — Other Ambulatory Visit: Payer: Self-pay | Admitting: Obstetrics and Gynecology

## 2016-11-08 DIAGNOSIS — Z803 Family history of malignant neoplasm of breast: Secondary | ICD-10-CM

## 2016-11-23 ENCOUNTER — Ambulatory Visit
Admission: RE | Admit: 2016-11-23 | Discharge: 2016-11-23 | Disposition: A | Discharge status: Home / Self Care | Payer: 59 | Source: Ambulatory Visit | Attending: Obstetrics and Gynecology | Admitting: Obstetrics and Gynecology

## 2016-11-23 DIAGNOSIS — N6489 Other specified disorders of breast: Secondary | ICD-10-CM | POA: Diagnosis not present

## 2016-11-23 DIAGNOSIS — Z803 Family history of malignant neoplasm of breast: Secondary | ICD-10-CM

## 2016-11-23 MED ORDER — GADOBENATE DIMEGLUMINE 529 MG/ML IV SOLN
20.0000 mL | Freq: Once | INTRAVENOUS | Status: AC | PRN
  Administered 2016-11-23: 20 mL via INTRAVENOUS

## 2016-11-28 IMAGING — MG MM DIAG BREAST TOMO BILATERAL
4 series · 4 of 16 positions shown · non-contrast
Comparison: Previous exam(s).

CLINICAL DATA: Six-month follow-up for probably benign mammographic
finding in the right breast without sonographic correlate.

EXAM:
DIGITAL DIAGNOSTIC BILATERAL MAMMOGRAM WITH 3D TOMOSYNTHESIS AND CAD

[R CC]
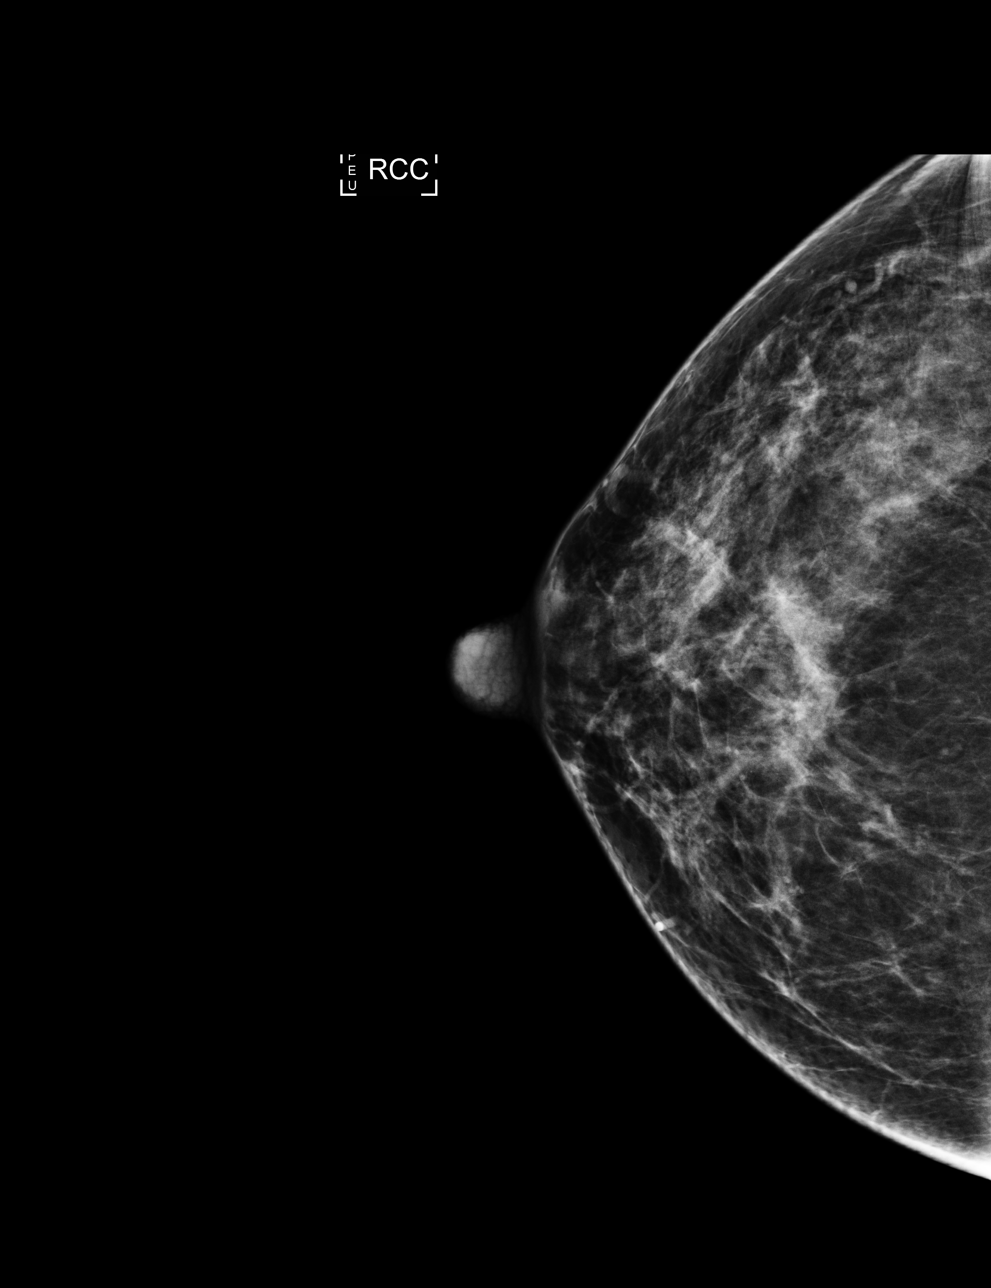

[R CC tomo · tomo slice 39/76.0]
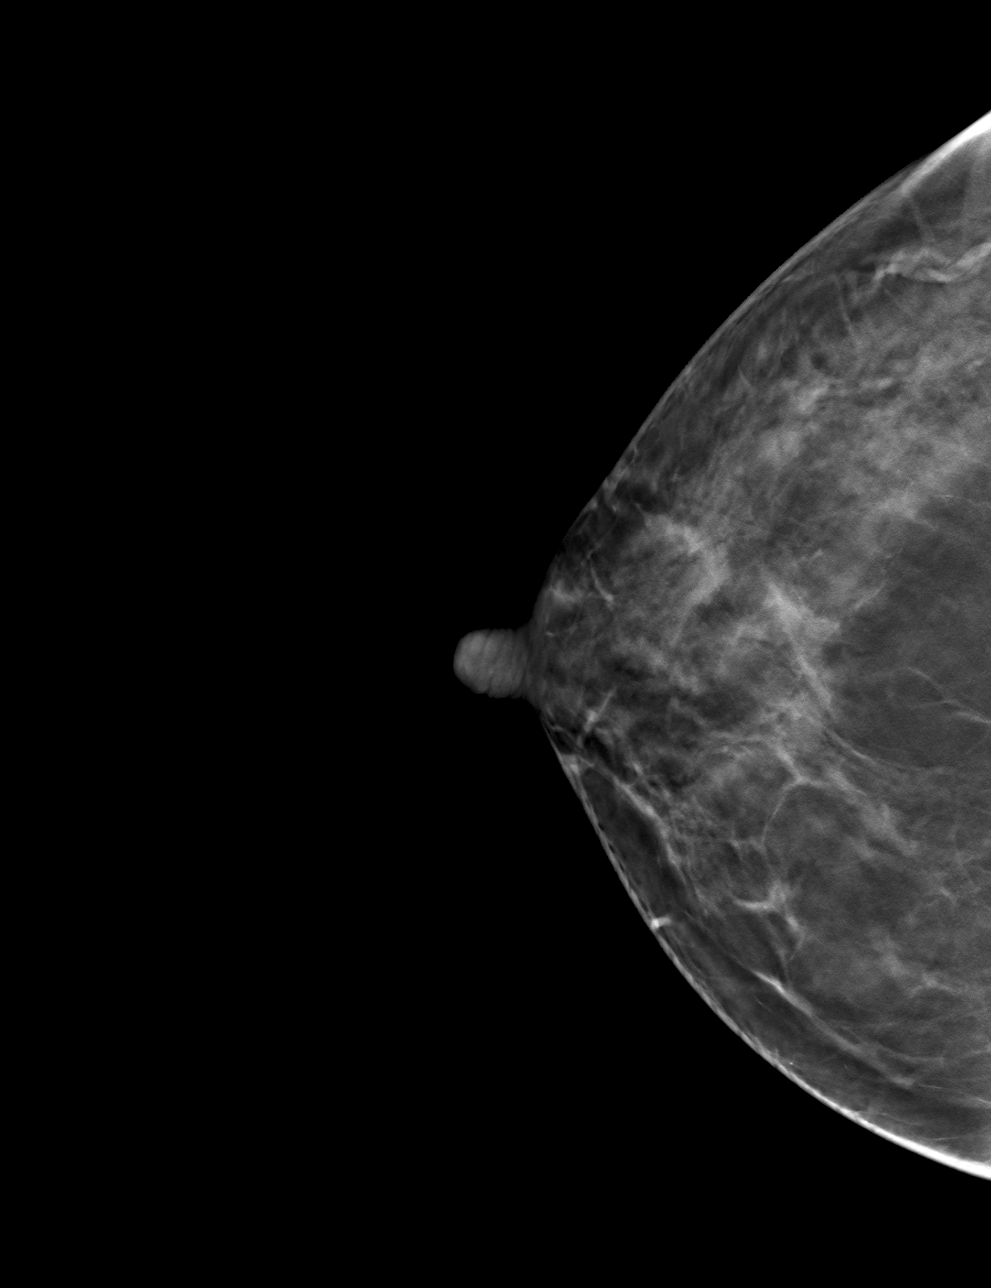

[L MLO tomo · tomo slice 39/76.0]
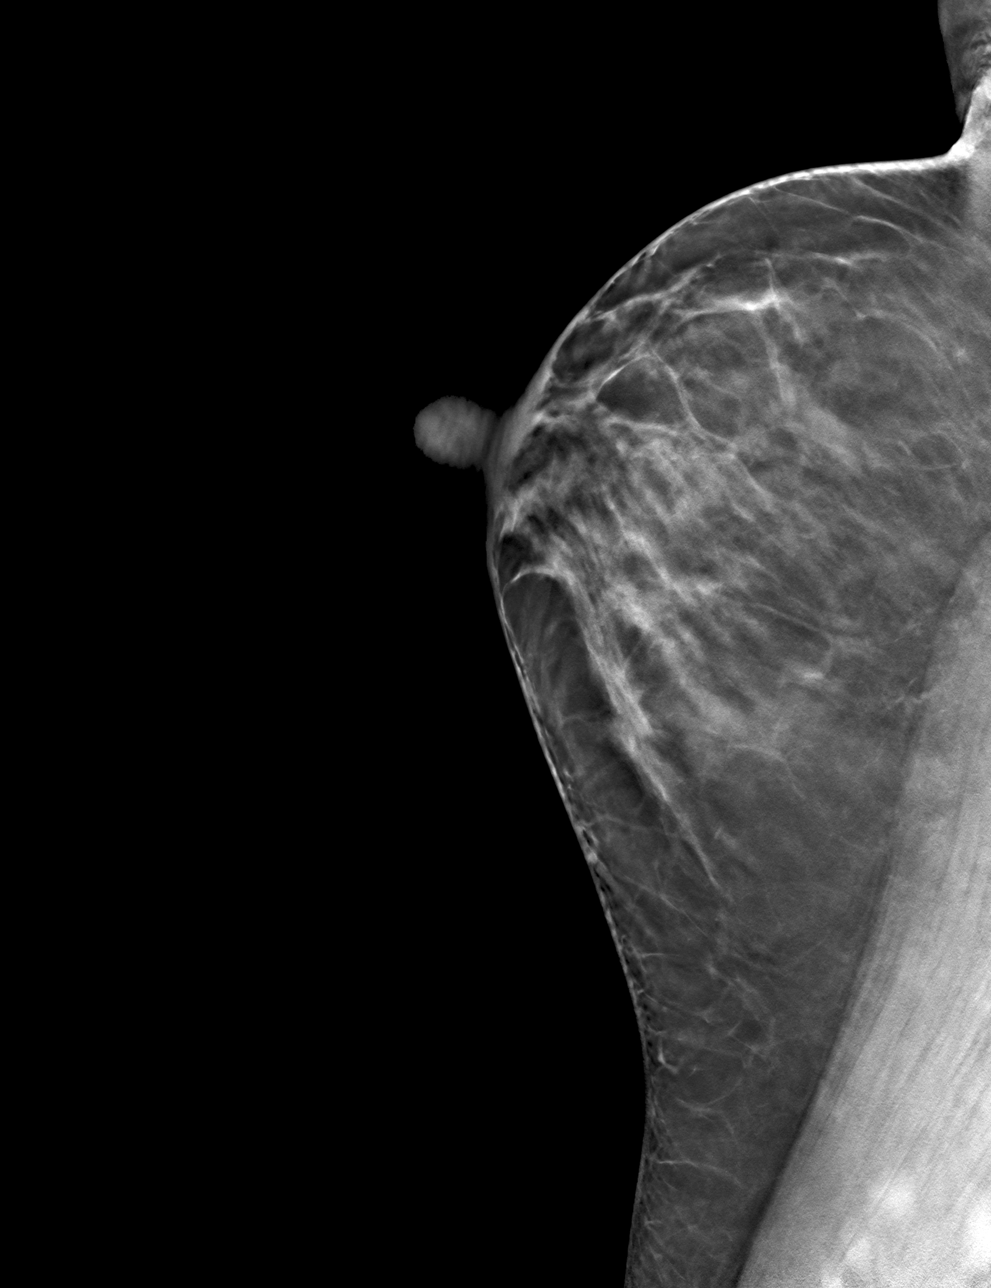

[R MLO tomo · tomo slice 39/77.0]
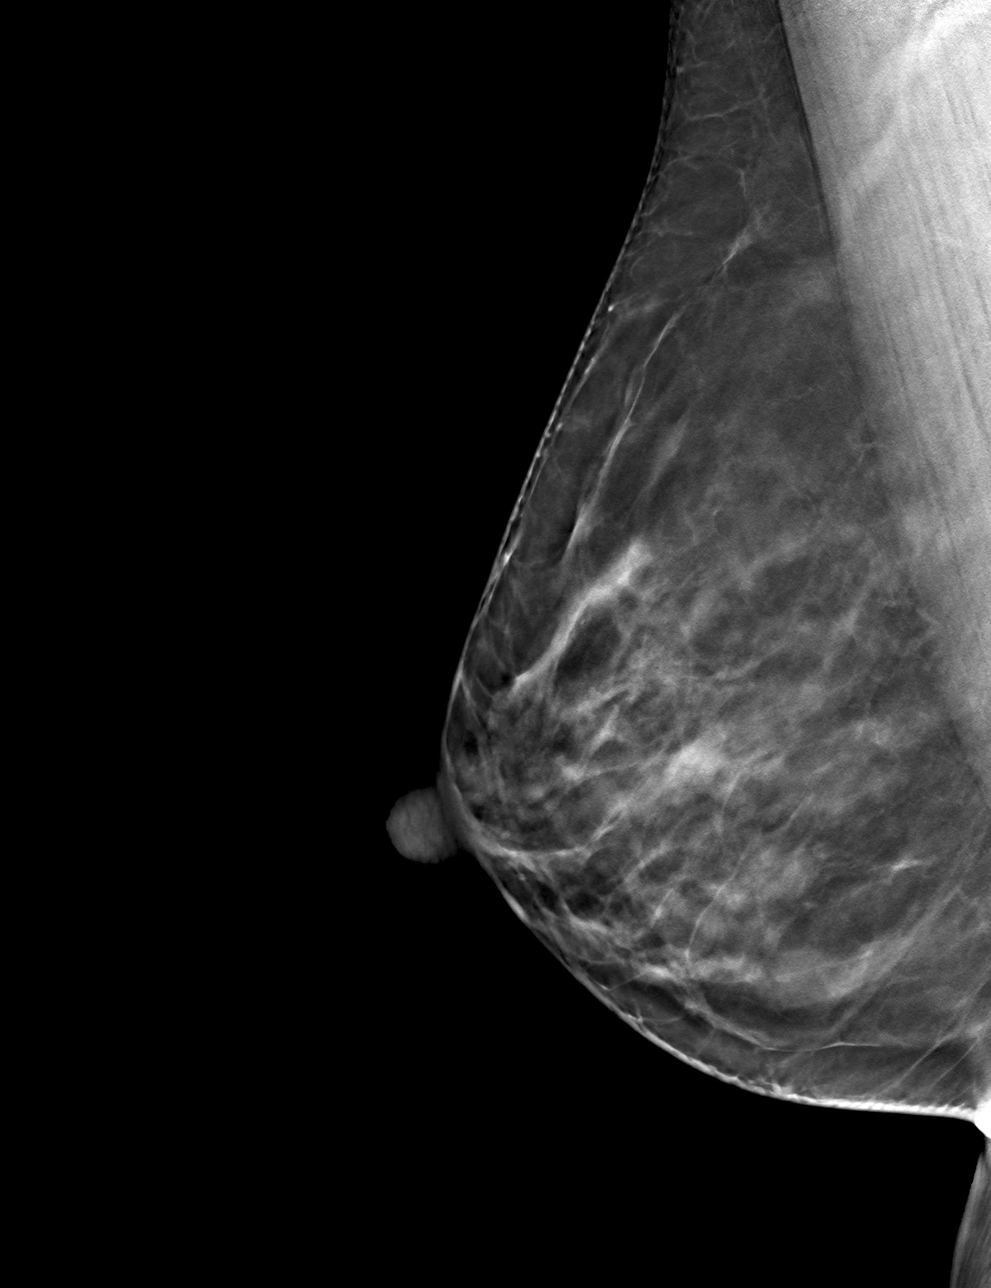

[4 of 16 positions shown; findings below may reference images not displayed]

ACR Breast Density Category c: The breast tissue is heterogeneously
dense, which may obscure small masses.
FINDINGS: There is architectural distortion within the outer right breast, 9
o'clock axis region, at middle depth, best seen on tomosynthesis CC
slice 16 and MLO slice 13.

There are no dominant masses, suspicious calcifications or secondary
signs of malignancy identified ELSEWHERE within the right breast.

There are no dominant masses, suspicious calcifications or secondary
signs of malignancy identified in the left breast.

Mammographic images were processed with CAD.
IMPRESSION: Architectural distortion within the outer right breast, 9 o'clock
axis region, at middle depth. This is a suspicious finding for which
stereotactic biopsy, with tomosynthesis guidance, is recommended.

RECOMMENDATION:
Stereotactic biopsy, with tomosynthesis guidance, for right breast
distortion.

Stereotactic biopsy is scheduled for [REDACTED] at 9 a.m.

I have discussed the findings and recommendations with the patient.
Results were also provided in writing at the conclusion of the
visit. If applicable, a reminder letter will be sent to the patient
regarding the next appointment.

BI-RADS CATEGORY  4: Suspicious.

## 2017-02-15 ENCOUNTER — Other Ambulatory Visit: Payer: Self-pay

## 2017-02-15 ENCOUNTER — Encounter: Payer: Self-pay | Admitting: Physician Assistant

## 2017-02-15 ENCOUNTER — Ambulatory Visit: Payer: 59 | Admitting: Physician Assistant

## 2017-02-15 VITALS — BP 96/62 | HR 62 | Temp 98.7°F | Resp 16 | Ht 69.25 in | Wt 210.0 lb

## 2017-02-15 DIAGNOSIS — Z20828 Contact with and (suspected) exposure to other viral communicable diseases: Secondary | ICD-10-CM

## 2017-02-15 DIAGNOSIS — R05 Cough: Secondary | ICD-10-CM

## 2017-02-15 DIAGNOSIS — R6889 Other general symptoms and signs: Secondary | ICD-10-CM | POA: Diagnosis not present

## 2017-02-15 DIAGNOSIS — R059 Cough, unspecified: Secondary | ICD-10-CM

## 2017-02-15 MED ORDER — OSELTAMIVIR PHOSPHATE 75 MG PO CAPS: 75.0000 mg | ORAL_CAPSULE | Freq: Two times a day (BID) | ORAL | 0 refills | Status: DC

## 2017-02-15 MED ORDER — HYDROCOD POLST-CPM POLST ER 10-8 MG/5ML PO SUER: 5.0000 mL | Freq: Two times a day (BID) | ORAL | 0 refills | Status: DC | PRN

## 2017-02-15 NOTE — Patient Instructions (Addendum)
Start taking Tamiflu  Tussionex for cough  Stay well hydrated -drink at least 2 liters of fluids daily. Get lost of rest. Wash your hands often.   -Foods that can help speed recovery: honey, garlic, chicken soup, elderberries, green tea.  -Supplements that can help speed recovery: vitamin C, zinc, elderberry extract, quercetin, ginseng, selenium -Supplement with prebiotics and probiotics:   Advil or ibuprofen for pain. Do not take Aspirin.  Drink enough water and fluids to keep your urine clear or pale yellow.  For sore throat: ? Gargle with 8 oz of salt water ( tsp of salt per 1 qt of water) as often as every 1-2 hours to soothe your throat.  Gargle liquid benadryl.  Cepacol throat lozenges (if you are not at risk for choking).  For sore throat try using a honey-based tea. Use 3 teaspoons of honey with juice squeezed from half lemon. Place shaved pieces of ginger into 1/2-1 cup of water and warm over stove top. Then mix the ingredients and repeat every 4 hours as needed.  Cough Syrup Recipe: Sweet Lemon & Honey Thyme  Ingredients a handful of fresh thyme sprigs   1 pint of water (2 cups)  1/2 cup honey (raw is best, but regular will do)  1/2 lemon chopped Instructions 1. Place the lemon in the pint jar and cover with the honey. The honey will macerate the lemons and draw out liquids which taste so delicious! 2. Meanwhile, toss the thyme leaves into a saucepan and cover them with the water. 3. Bring the water to a gentle simmer and reduce it to half, about a cup of tea. 4. When the tea is reduced and cooled a bit, strain the sprigs & leaves, add it into the pint jar and stir it well. 5. Give it a shake and use a spoonful as needed. 6. Store your homemade cough syrup in the refrigerator for about a month.  Is there anything I can do on my own to get rid of my cough? Yes. To help get rid of your cough, you can: ?Use a humidifier in your bedroom ?Use an over-the-counter cough  medicine, or suck on cough drops or hard candy ?Stop smoking, if you smoke ?If you have allergies, avoid the things you are allergic to (like pollen, dust, animals, or mold) If you have acid reflux, your doctor or nurse will tell you which lifestyle changes can help reduce symptoms.     Chlorpheniramine; Hydrocodone oral solution or suspension What is this medicine? CHLORPHENIRAMINE; HYDROCODONE (klor fen IR a meen; hye droe KOE done) is a combination of an antihistamine and cough suppressant. It is used to treat the symptoms of allergies and colds. This medicine may be used for other purposes; ask your health care provider or pharmacist if you have questions. COMMON BRAND NAME(S): HyTan, Novasus, S-T Forte 2, Tussionex, VITUZ What should I tell my health care provider before I take this medicine? They need to know if you have any of these conditions: -diabetes -drug abuse or addiction -head injury -heart disease -kidney disease -liver disease -lung or breathing disease, like asthma -problems urinating -seizures -stomach or intestine problems -an unusual or allergic reaction to chlorpheniramine, hydrocodone, other medicines, foods, dyes, or preservatives -pregnant or trying to get pregnant -breast-feeding How should I use this medicine? Take this medicine by mouth. Follow the directions on the prescription label. Shake well before using. Use a specially marked spoon or container to measure each dose. Household spoons are not accurate.  You can take it with or without food. If it upsets your stomach, take it with food. Take your medicine at regular intervals. Do not take it more often than directed. A special MedGuide will be given to you by the pharmacist with each prescription and refill. Be sure to read this information carefully each time. Talk to your pediatrician regarding the use of this medicine in children. This medicine is not approved for use in children. Overdosage: If you  think you have taken too much of this medicine contact a poison control center or emergency room at once. NOTE: This medicine is only for you. Do not share this medicine with others. What if I miss a dose? If you miss a dose, take it as soon as you can. If it is almost time for your next dose, take only that dose. Do not take double or extra doses. What may interact with this medicine? Do not take this medicine with any of the following medications: -alcohol -certain medicines for anxiety or sleep -certain medicines for depression like amitriptyline, fluoxetine, sertraline -certain medicines for seizures like phenobarbital, primidone -general anesthetics like halothane, isoflurane, methoxyflurane, propofol -local anesthetics like lidocaine, pramoxine, tetracaine -MAOIs like Carbex, Eldepryl, Nardil, and Parnate -medicines that relax muscles for surgery -other antihistamines for allergy, cough and cold -other narcotic medicines for pain or cough -phenothiazines like chlorpromazine, mesoridazine, prochlorperazine, thioridazine This medicine may also interact with the following medications: -antiviral medicines for HIV or AIDS -atropine -certain antibiotics like clarithromycin, erythromycin -certain medicines for bladder problems like oxybutynin, tolterodine -certain medicines for fungal infections like ketoconazole and itraconazole -certain medicines for Parkinson's disease like benztropine, trihexyphenidyl -certain medicines for stomach problems like dicyclomine, hyoscyamine -certain medicines for travel sickness like scopolamine -ipratropium -rifampin This list may not describe all possible interactions. Give your health care provider a list of all the medicines, herbs, non-prescription drugs, or dietary supplements you use. Also tell them if you smoke, drink alcohol, or use illegal drugs. Some items may interact with your medicine. What should I watch for while using this medicine? Use  exactly as directed by your doctor or health care professional. Do not take more than the recommended dose. You may develop tolerance to this medicine if you take it for a long time. Tolerance means that you will get less cough relief with time. Tell your doctor or health care professional if your symptoms do not improve or if they get worse. If you have been taking this medicine for a long time, do not suddenly stop taking it because you may develop a severe reaction. Your body becomes used to the medicine. This does NOT mean you are addicted. Addiction is a behavior related to getting and using a drug for a nonmedical reason. If your doctor wants you to stop the medicine, the dose will be slowly lowered over time to avoid any side effects. There are different types of narcotic medicines (opiates). If you take more than one type at the same time or if you are taking another medicine that also causes drowsiness, you may have more side effects. Give your health care provider a list of all medicines you use. Your doctor will tell you how much medicine to take. Do not take more medicine than directed. Call emergency for help if you have problems breathing or unusual sleepiness. You may get drowsy or dizzy. Do not drive, use machinery, or do anything that needs mental alertness until you know how this medicine affects you. Do not stand  or sit up quickly, especially if you are an older patient. This reduces the risk of dizzy or fainting spells. Alcohol may interfere with the effect of this medicine. Avoid alcoholic drinks. The medicine will cause constipation. Try to have a bowel movement at least every 2 to 3 days. If you do not have a bowel movement for 3 days, call your doctor or health care professional. Your mouth may get dry. Chewing sugarless gum or sucking hard candy, and drinking plenty of water may help. Contact your doctor if the problem does not go away or is severe. This medicine may cause dry eyes and  blurred vision. If you wear contact lenses, you may feel some discomfort. Lubricating drops may help. See your eye doctor if the problem does not go away or is severe. What side effects may I notice from receiving this medicine? Side effects that you should report to your doctor or health care professional as soon as possible: -allergic reactions like skin rash, itching or hives, swelling of the face, lips, or tongue -breathing problems -confusion -signs and symptoms of low blood pressure like dizziness; feeling faint or lightheaded, falls; unusually weak or tired -trouble passing urine or change in the amount of urine Side effects that usually do not require medical attention (report to your doctor or health care professional if they continue or are bothersome): -constipation -dry mouth -nausea, vomiting -tiredness This list may not describe all possible side effects. Call your doctor for medical advice about side effects. You may report side effects to FDA at 1-800-FDA-1088. Where should I keep my medicine? Keep out of the reach of children. This medicine can be abused. Keep this medicine in a safe place to protect it from theft. Do not share this medicine with anyone. Selling or giving away this medicine is dangerous and is against the law. This medicine may cause accidental overdose and death if taken by other adults, children, or pets. Mix any unused medicine with a substance like cat littler or coffee grounds. Then throw the medicine away in a sealed container like a sealed bag or a coffee can with a lid. Do not use the medicine after the expiration date. Store at room temperature between 15 and 30 degrees C (59 and 86 degrees F). Do not freeze. Keep container tightly closed. NOTE: This sheet is a summary. It may not cover all possible information. If you have questions about this medicine, talk to your doctor, pharmacist, or health care provider.  2018 Elsevier/Gold Standard (2016-01-19  22:55:41)  IF you received an x-ray today, you will receive an invoice from [facility]. Please contact [facility] at [phone] with questions or concerns regarding your invoice.   IF you received labwork today, you will receive an invoice from [facility]. Please contact [facility] at [phone] with questions or concerns regarding your invoice.   Our billing staff will not be able to assist you with questions regarding bills from these companies.  You will be contacted with the lab results as soon as they are available. The fastest way to get your results is to activate your My Chart account. Instructions are located on the last page of this paperwork. If you have not heard from [place] regarding the results in 2 weeks, please contact this office.

## 2017-02-15 NOTE — Progress Notes (Signed)
Nichole Arnold  MRN: [id number] DOB: 05/14/1965  PCP: Abner Greenspan, MD  Subjective:  Pt is a 52 year old female who presents to clinic for fever and sore throat x this morning. Fever of 101. She took ibuprofen.  She has recent exposure to coworker +flu.   Denies cough, body aches, chills, HA, n/v.  Review of Systems  Constitutional: Positive for fever. Negative for chills, diaphoresis and fatigue.  HENT: Positive for sore throat. Negative for congestion, postnasal drip, rhinorrhea, sinus pressure and sinus pain.   Respiratory: Negative for cough, shortness of breath and wheezing.   Psychiatric/Behavioral: Negative for sleep disturbance.    Patient Active Problem List   Diagnosis Date Noted  . Hyperlipidemia 03/01/2016  . Atypical ductal hyperplasia of right breast 01/12/2015  . Breast cancer screening, high risk patient 01/12/2015  . Family history- stomach cancer 08/11/2012  . Anxiety disorder 08/11/2012  . Routine general medical examination at a health care facility 08/05/2012  . Unspecified family circumstance 11/09/2010  . ALLERGIC RHINITIS 08/05/2006  . Asthma 08/05/2006  . SKIN CANCER, HX OF 08/05/2006    Current Outpatient Medications on File Prior to Visit  Medication Sig Dispense Refill  . albuterol (PROVENTIL HFA;VENTOLIN HFA) 108 (90 Base) MCG/ACT inhaler Inhale 2 puffs into the lungs every 4 (four) hours as needed for wheezing or shortness of breath (cough, shortness of breath or wheezing.). 1 Inhaler 1  . cetirizine (ZYRTEC) 10 MG tablet Take 10 mg by mouth daily as needed.     . Fluticasone-Salmeterol (ADVAIR DISKUS) 100-50 MCG/DOSE AEPB INHALE ONE (1) PUFF EVERY 12 HOURS AS DIRECTED. RINSE MOUTH AFTER EACH USE. 60 each 5  . ibuprofen (ADVIL,MOTRIN) 200 MG tablet Take 800 mg by mouth every 6 (six) hours as needed for pain. Reported on 01/12/2015    . ALPRAZolam (XANAX) 0.25 MG tablet Take 1 tablet (0.25 mg total) by mouth daily as needed for sleep. (Patient  not taking: Reported on 02/15/2017) 20 tablet 0   No current facility-administered medications on file prior to visit.     No Known Allergies   Objective:  BP 96/62 (BP Location: Right Arm, Patient Position: Sitting, Cuff Size: Large)   Pulse 62   Temp 98.7 F (37.1 C) (Oral)   Resp 16   Ht 5' 9.25" (1.759 m)   Wt 210 lb (95.3 kg)   SpO2 98%   BMI 30.79 kg/m   Physical Exam  Constitutional: She is oriented to person, place, and time and well-developed, well-nourished, and in no distress. No distress.  HENT:  Right Ear: Tympanic membrane normal.  Left Ear: Tympanic membrane normal.  Nose: Mucosal edema present. No rhinorrhea. Right sinus exhibits no maxillary sinus tenderness and no frontal sinus tenderness. Left sinus exhibits no maxillary sinus tenderness and no frontal sinus tenderness.  Mouth/Throat: Oropharynx is clear and moist and mucous membranes are normal.  Cardiovascular: Normal rate, regular rhythm and normal heart sounds.  Pulmonary/Chest: Effort normal and breath sounds normal. No respiratory distress. She has no wheezes. She has no rales.  Neurological: She is alert and oriented to person, place, and time. GCS score is 15.  Skin: Skin is warm and dry.  Psychiatric: Mood, memory, affect and judgment normal.  Vitals reviewed.   Assessment and Plan :  1. Exposure to the flu 2. Flu-like symptoms - oseltamivir (TAMIFLU) 75 MG capsule; Take 1 capsule (75 mg total) by mouth 2 (two) times daily.  Dispense: 10 capsule; Refill: 0 - Will not  test as pt has +flu exposure with onset symptoms x a few hours. Plan to treat. Advised hydration. RTC if no improvement in 5-7 days.  3. Cough - chlorpheniramine-HYDROcodone (TUSSIONEX PENNKINETIC ER) 10-8 MG/5ML SUER; Take 5 mLs by mouth every 12 (twelve) hours as needed for cough.  Dispense: 100 mL; Refill: 0   Whitney Carmon Brigandi, PA-C  [facility] 02/15/2017 1:28 PM

## 2017-04-22 ENCOUNTER — Ambulatory Visit: Payer: 59 | Admitting: Family Medicine

## 2017-04-22 ENCOUNTER — Encounter: Payer: Self-pay | Admitting: Family Medicine

## 2017-04-22 VITALS — BP 122/70 | HR 62 | Temp 98.3°F | Ht 69.25 in | Wt 225.0 lb

## 2017-04-22 DIAGNOSIS — E669 Obesity, unspecified: Secondary | ICD-10-CM | POA: Insufficient documentation

## 2017-04-22 DIAGNOSIS — E6609 Other obesity due to excess calories: Secondary | ICD-10-CM

## 2017-04-22 DIAGNOSIS — Z113 Encounter for screening for infections with a predominantly sexual mode of transmission: Secondary | ICD-10-CM | POA: Insufficient documentation

## 2017-04-22 DIAGNOSIS — Z6832 Body mass index (BMI) 32.0-32.9, adult: Secondary | ICD-10-CM

## 2017-04-22 NOTE — Patient Instructions (Signed)
Take care of yourself   Use condoms if needed to prevent STDs   Screen today for GC/chlamydia and HIV and Syphilis   If you develop any symptoms please let us know   Continue annual follow up with gyn for pap/etc   For weight loss Try to get most of your carbohydrates from produce (with the exception of white potatoes)  Eat less bread/pasta/rice/snack foods/cereals/sweets and other items from the middle of the grocery store (processed carbs)

## 2017-04-22 NOTE — Assessment & Plan Note (Signed)
In pt with a new partner No symptoms  Cervical swab for gc/chlamydia sent  Lab for HIV and RPR Will update  Enc condom use until monogamous

## 2017-04-22 NOTE — Assessment & Plan Note (Signed)
Discussed how this problem influences overall health and the risks it imposes  Reviewed plan for weight loss with lower calorie diet (via better food choices and also portion control or program like weight watchers) and exercise building up to or more than 30 minutes 5 days per week including some aerobic activity   Little time for self care Disc limiting processed carbs  Disc fitting in exercise when able

## 2017-04-22 NOTE — Progress Notes (Signed)
Subjective:    Patient ID: Nichole Arnold, female    DOB: September 22, 1965, 52 y.o.   MRN: [id number]  HPI Here for STD screen   She is in a new relationship   No known exposures to STD No cramping  No vaginal d/c   Had one yeast infection-monistat otc took care of that    Mild spotting yesterday  Not had a period since November   (blood work in [place] showed not fully menopausal)  She has been having night sweats for 5 years   Had essure and thermal ablation in the past - not pregnant    Wt Readings from Last 3 Encounters:  04/22/17 225 lb (102.1 kg)  02/15/17 210 lb (95.3 kg)  02/29/16 216 lb 8 oz (98.2 kg)  she is doing whole foods and no alcohol /less processed foods  Exercise-back to crossfit  32.99 kg/m    BP Readings from Last 3 Encounters:  04/22/17 122/70  02/15/17 96/62  02/29/16 120/66   Lots of stress lately- selling a house /process     A few bumps in vaginal area-thinks there are ingrown hairs   Last pap 10/17 with gyn   Gave blood in feb - HIV and hep C screen should have been done  No high risk behavior   Patient Active Problem List   Diagnosis Date Noted  . Screen for STD (sexually transmitted disease) 04/22/2017  . Hyperlipidemia 03/01/2016  . Atypical ductal hyperplasia of right breast 01/12/2015  . Breast cancer screening, high risk patient 01/12/2015  . Family history- stomach cancer 08/11/2012  . Anxiety disorder 08/11/2012  . Routine general medical examination at a health care facility 08/05/2012  . Unspecified family circumstance 11/09/2010  . ALLERGIC RHINITIS 08/05/2006  . Asthma 08/05/2006  . SKIN CANCER, HX OF 08/05/2006   Past Medical History:  Diagnosis Date  . Allergic rhinitis   . Allergy   . Anxiety   . Asthma   . Basal cell carcinoma of ear    Left  . Basal cell carcinoma of forehead   . Benign paroxysmal positional vertigo    Past Surgical History:  Procedure Laterality Date  . BASAL CELL CARCINOMA EXCISION   9/08   left ear  . BREAST SURGERY    . CERVICAL CONE BIOPSY    . CESAREAN SECTION  12/00  . MELANOMA EXCISION     pre melanoma  . NEUROMA SURGERY  6/02   finger  . RADIOACTIVE SEED GUIDED EXCISIONAL BREAST BIOPSY Right 12/08/2014   Procedure: RADIOACTIVE SEED GUIDED EXCISIONAL BREAST BIOPSY;  Surgeon: Rolm Bookbinder, MD;  Location: [facility];  Service: General;  Laterality: Right;   Social History   Tobacco Use  . Smoking status: Never Smoker  . Smokeless tobacco: Never Used  Substance Use Topics  . Alcohol use: Yes    Alcohol/week: 0.0 oz    Comment: Occasional/ Social  . Drug use: No   Family History  Problem Relation Age of Onset  . Asthma Mother   . Stomach cancer Mother   . Heart failure Unknown        GM  . Stomach cancer Unknown        GF  . Breast cancer Maternal Grandmother 64  . Uterine cancer Maternal Grandmother   . Colon polyps Father   . Stomach cancer Maternal Grandfather   . Healthy Sister    No Known Allergies Current Outpatient Medications on File Prior to Visit  Medication Sig  Dispense Refill  . albuterol (PROVENTIL HFA;VENTOLIN HFA) 108 (90 Base) MCG/ACT inhaler Inhale 2 puffs into the lungs every 4 (four) hours as needed for wheezing or shortness of breath (cough, shortness of breath or wheezing.). 1 Inhaler 1  . cetirizine (ZYRTEC) 10 MG tablet Take 10 mg by mouth daily as needed.     . Fluticasone-Salmeterol (ADVAIR DISKUS) 100-50 MCG/DOSE AEPB INHALE ONE (1) PUFF EVERY 12 HOURS AS DIRECTED. RINSE MOUTH AFTER EACH USE. 60 each 5  . ibuprofen (ADVIL,MOTRIN) 200 MG tablet Take 800 mg by mouth every 6 (six) hours as needed for pain. Reported on 01/12/2015     No current facility-administered medications on file prior to visit.      Review of Systems  Constitutional: Negative for activity change, appetite change, fatigue, fever and unexpected weight change.  HENT: Negative for congestion, ear pain, rhinorrhea, sinus pressure and  sore throat.   Eyes: Negative for pain, redness and visual disturbance.  Respiratory: Negative for cough, shortness of breath and wheezing.   Cardiovascular: Negative for chest pain and palpitations.  Gastrointestinal: Negative for abdominal pain, blood in stool, constipation and diarrhea.  Endocrine: Negative for polydipsia and polyuria.  Genitourinary: Negative for dysuria, frequency, genital sores, pelvic pain, urgency and vaginal discharge.  Musculoskeletal: Negative for arthralgias, back pain and myalgias.  Skin: Negative for pallor and rash.  Allergic/Immunologic: Negative for environmental allergies.  Neurological: Negative for dizziness, syncope and headaches.  Hematological: Negative for adenopathy. Does not bruise/bleed easily.  Psychiatric/Behavioral: Negative for decreased concentration and dysphoric mood. The patient is not nervous/anxious.        Pos for stressors        Objective:   Physical Exam  Constitutional: She appears well-developed and well-nourished. No distress.  overwt and well app  HENT:  Head: Normocephalic and atraumatic.  Mouth/Throat: Oropharynx is clear and moist.  Eyes: Pupils are equal, round, and reactive to light. Conjunctivae and EOM are normal. No scleral icterus.  Neck: Normal range of motion. Neck supple.  Cardiovascular: Normal rate and normal heart sounds.  Abdominal: Soft. Bowel sounds are normal. She exhibits no distension. There is no tenderness.  No suprapubic tenderness or fullness    Genitourinary:  Genitourinary Comments: Few ingrown hairs from waxing noted -none appear infected  No lesions  No vaginal d/c or odor  Lymphadenopathy:    She has no cervical adenopathy.  Neurological: She is alert. She displays no tremor.  Skin: Skin is warm and dry. No rash noted. No erythema. No pallor.  Psychiatric: She has a normal mood and affect.          Assessment & Plan:   Problem List Items Addressed This Visit      Other   Obese      Discussed how this problem influences overall health and the risks it imposes  Reviewed plan for weight loss with lower calorie diet (via better food choices and also portion control or program like weight watchers) and exercise building up to or more than 30 minutes 5 days per week including some aerobic activity   Little time for self care Disc limiting processed carbs  Disc fitting in exercise when able       Screen for STD (sexually transmitted disease) - Primary    In pt with a new partner No symptoms  Cervical swab for gc/chlamydia sent  Lab for HIV and RPR Will update  Enc condom use until monogamous      Relevant Orders  HIV antibody   RPR   C. trachomatis/N. gonorrhoeae RNA

## 2017-04-23 LAB — HIV ANTIBODY (ROUTINE TESTING W REFLEX): HIV 1&2 Ab, 4th Generation: NONREACTIVE

## 2017-04-23 LAB — C. TRACHOMATIS/N. GONORRHOEAE RNA
C. trachomatis RNA, TMA: NOT DETECTED
N. GONORRHOEAE RNA, TMA: NOT DETECTED

## 2017-04-23 LAB — RPR: RPR Ser Ql: NONREACTIVE

## 2017-04-29 ENCOUNTER — Ambulatory Visit: Payer: Self-pay | Admitting: Family Medicine

## 2017-05-08 DIAGNOSIS — I8312 Varicose veins of left lower extremity with inflammation: Secondary | ICD-10-CM | POA: Diagnosis not present

## 2017-05-08 DIAGNOSIS — I8311 Varicose veins of right lower extremity with inflammation: Secondary | ICD-10-CM | POA: Diagnosis not present

## 2017-05-16 DIAGNOSIS — I8311 Varicose veins of right lower extremity with inflammation: Secondary | ICD-10-CM | POA: Diagnosis not present

## 2017-05-16 DIAGNOSIS — I8312 Varicose veins of left lower extremity with inflammation: Secondary | ICD-10-CM | POA: Diagnosis not present

## 2017-05-24 ENCOUNTER — Encounter: Payer: Self-pay | Admitting: Family Medicine

## 2017-05-24 ENCOUNTER — Ambulatory Visit: Payer: 59 | Admitting: Family Medicine

## 2017-05-24 DIAGNOSIS — J069 Acute upper respiratory infection, unspecified: Secondary | ICD-10-CM | POA: Diagnosis not present

## 2017-05-24 DIAGNOSIS — R059 Cough, unspecified: Secondary | ICD-10-CM

## 2017-05-24 DIAGNOSIS — R05 Cough: Secondary | ICD-10-CM | POA: Diagnosis not present

## 2017-05-24 DIAGNOSIS — B9789 Other viral agents as the cause of diseases classified elsewhere: Secondary | ICD-10-CM | POA: Diagnosis not present

## 2017-05-24 MED ORDER — HYDROCOD POLST-CPM POLST ER 10-8 MG/5ML PO SUER: 5.0000 mL | Freq: Two times a day (BID) | ORAL | 0 refills | Status: DC | PRN

## 2017-05-24 MED ORDER — FLUTICASONE-SALMETEROL 100-50 MCG/DOSE IN AEPB: INHALATION_SPRAY | RESPIRATORY_TRACT | 5 refills | Status: DC

## 2017-05-24 MED ORDER — ALBUTEROL SULFATE HFA 108 (90 BASE) MCG/ACT IN AERS: 2.0000 | INHALATION_SPRAY | RESPIRATORY_TRACT | 1 refills | Status: DC | PRN

## 2017-05-24 NOTE — Patient Instructions (Signed)
Restart your inhalers, use the cough medicine as needed- sedation caution.  Update [place] as needed.  Take care.  Glad to see you.

## 2017-05-24 NOTE — Progress Notes (Signed)
She is out of advair and albuterol.  She has h/o asthma.    She got sick about 4 days ago.  Coughing.  Scant sputum.  No known wheeze.  No vomiting, no diarrhea.  No fevers.  No facial pain.  Some ST.  Chest is sore from coughing.  No ear pain.  No rash.    Meds, vitals, and allergies reviewed.   ROS: Per HPI unless specifically indicated in ROS section   GEN: nad, alert and oriented HEENT: mucous membranes moist, tm w/o erythema, nasal exam w/o erythema, clear discharge noted,  OP with minimal cobblestoning NECK: supple w/o LA CV: rrr.   PULM: ctab except for scant ext wheeze B, no inc wob, no stridor EXT: no edema SKIN: well perfused.

## 2017-05-26 DIAGNOSIS — R05 Cough: Secondary | ICD-10-CM | POA: Insufficient documentation

## 2017-05-26 DIAGNOSIS — R059 Cough, unspecified: Secondary | ICD-10-CM | POA: Insufficient documentation

## 2017-05-26 NOTE — Assessment & Plan Note (Signed)
Nontoxic.  Okay for outpatient follow-up.  Rationale for treatment discussed with patient. Restart both inhalers, use the tussionex as needed- sedation caution.  Update [place] as needed.  She agrees.

## 2017-06-03 ENCOUNTER — Telehealth: Payer: Self-pay | Admitting: Family Medicine

## 2017-06-03 DIAGNOSIS — E7800 Pure hypercholesterolemia, unspecified: Secondary | ICD-10-CM

## 2017-06-03 DIAGNOSIS — Z0000 Encounter for general adult medical examination without abnormal findings: Secondary | ICD-10-CM

## 2017-06-03 NOTE — Telephone Encounter (Signed)
-----   Message from Ellamae Sia sent at 05/27/2017 10:41 AM EDT ----- Regarding: Lab orders for Tuesday, 5.28.19 Patient is scheduled for CPX labs, please order future labs, Thanks , Karna Christmas

## 2017-06-04 ENCOUNTER — Other Ambulatory Visit (INDEPENDENT_AMBULATORY_CARE_PROVIDER_SITE_OTHER): Payer: 59

## 2017-06-04 DIAGNOSIS — Z0000 Encounter for general adult medical examination without abnormal findings: Secondary | ICD-10-CM

## 2017-06-04 DIAGNOSIS — E7800 Pure hypercholesterolemia, unspecified: Secondary | ICD-10-CM

## 2017-06-04 LAB — CBC WITH DIFFERENTIAL/PLATELET
BASOS ABS: 0 10*3/uL (ref 0.0–0.1)
Basophils Relative: 0.6 % (ref 0.0–3.0)
Eosinophils Absolute: 0.2 10*3/uL (ref 0.0–0.7)
Eosinophils Relative: 3.2 % (ref 0.0–5.0)
HCT: 38.1 % (ref 36.0–46.0)
Hemoglobin: 12.9 g/dL (ref 12.0–15.0)
LYMPHS ABS: 1.9 10*3/uL (ref 0.7–4.0)
Lymphocytes Relative: 29.8 % (ref 12.0–46.0)
MCHC: 33.9 g/dL (ref 30.0–36.0)
MCV: 94.6 fl (ref 78.0–100.0)
MONO ABS: 0.7 10*3/uL (ref 0.1–1.0)
MONOS PCT: 11.4 % (ref 3.0–12.0)
NEUTROS PCT: 55 % (ref 43.0–77.0)
Neutro Abs: 3.5 10*3/uL (ref 1.4–7.7)
Platelets: 314 10*3/uL (ref 150.0–400.0)
RBC: 4.02 Mil/uL (ref 3.87–5.11)
RDW: 12.8 % (ref 11.5–15.5)
WBC: 6.3 10*3/uL (ref 4.0–10.5)

## 2017-06-04 LAB — COMPREHENSIVE METABOLIC PANEL
ALBUMIN: 4 g/dL (ref 3.5–5.2)
ALK PHOS: 44 U/L (ref 39–117)
ALT: 23 U/L (ref 0–35)
AST: 27 U/L (ref 0–37)
BILIRUBIN TOTAL: 0.4 mg/dL (ref 0.2–1.2)
BUN: 19 mg/dL (ref 6–23)
CO2: 26 mEq/L (ref 19–32)
Calcium: 9 mg/dL (ref 8.4–10.5)
Chloride: 104 mEq/L (ref 96–112)
Creatinine, Ser: 0.84 mg/dL (ref 0.40–1.20)
GFR: 75.91 mL/min (ref >60.00)
GLUCOSE: 114 mg/dL — AB (ref 70–99)
Potassium: 4.4 mEq/L (ref 3.5–5.1)
Sodium: 137 mEq/L (ref 135–145)
TOTAL PROTEIN: 7 g/dL (ref 6.0–8.3)

## 2017-06-04 LAB — LIPID PANEL
CHOLESTEROL: 242 mg/dL — AB (ref 0–200)
HDL: 77.1 mg/dL (ref >39.00)
LDL Cholesterol: 143 mg/dL — ABNORMAL HIGH (ref 0–99)
NONHDL: 165.17
TRIGLYCERIDES: 110 mg/dL (ref 0.0–149.0)
Total CHOL/HDL Ratio: 3
VLDL: 22 mg/dL (ref 0.0–40.0)

## 2017-06-04 LAB — TSH: TSH: 1.87 u[IU]/mL (ref 0.35–4.50)

## 2017-06-12 ENCOUNTER — Encounter: Payer: Self-pay | Admitting: Family Medicine

## 2017-06-12 DIAGNOSIS — I8312 Varicose veins of left lower extremity with inflammation: Secondary | ICD-10-CM | POA: Diagnosis not present

## 2017-06-13 ENCOUNTER — Ambulatory Visit (INDEPENDENT_AMBULATORY_CARE_PROVIDER_SITE_OTHER): Payer: 59 | Admitting: Family Medicine

## 2017-06-13 ENCOUNTER — Encounter: Payer: Self-pay | Admitting: Family Medicine

## 2017-06-13 VITALS — BP 122/72 | HR 61 | Temp 98.2°F | Ht 69.75 in | Wt 220.2 lb

## 2017-06-13 DIAGNOSIS — R7309 Other abnormal glucose: Secondary | ICD-10-CM | POA: Diagnosis not present

## 2017-06-13 DIAGNOSIS — E7800 Pure hypercholesterolemia, unspecified: Secondary | ICD-10-CM

## 2017-06-13 DIAGNOSIS — Z6831 Body mass index (BMI) 31.0-31.9, adult: Secondary | ICD-10-CM

## 2017-06-13 DIAGNOSIS — Z0000 Encounter for general adult medical examination without abnormal findings: Secondary | ICD-10-CM

## 2017-06-13 DIAGNOSIS — E6609 Other obesity due to excess calories: Secondary | ICD-10-CM | POA: Diagnosis not present

## 2017-06-13 NOTE — Assessment & Plan Note (Signed)
Discussed how this problem influences overall health and the risks it imposes  Reviewed plan for weight loss with lower calorie diet (via better food choices and also portion control or program like weight watchers) and exercise building up to or more than 30 minutes 5 days per week including some aerobic activity   She is starting a new program- excited to work on that

## 2017-06-13 NOTE — Assessment & Plan Note (Signed)
Fasting glucose 114- obese and fam hx of prediabetes disc imp of low glycemic diet and wt loss to prevent DM2  Will check A1C in 6 mo

## 2017-06-13 NOTE — Assessment & Plan Note (Signed)
Reviewed health habits including diet and exercise and skin cancer prevention Reviewed appropriate screening tests for age  Also reviewed health mt list, fam hx and immunization status , as well as social and family history   See HPI Labs reviewed  Starting diet/exercise for wt loss  Disc sun protection for skin cancer prevention

## 2017-06-13 NOTE — Progress Notes (Signed)
Subjective:    Patient ID: Nichole Arnold, female    DOB: 1965/04/28, 52 y.o.   MRN: [id number]  HPI  Here for health maintenance exam and to review chronic medical problems    Very busy at work  Playing some golf  Traveling for work   Trying to get over uri  Still hoarse  Not resting enough to get better  Saw Dr Damita Dunnings in May   Yard work- worse allergies also   Uses zyrtec advair  nyquil    Wt Readings from Last 3 Encounters:  06/13/17 220 lb 4 oz (99.9 kg)  05/24/17 226 lb (102.5 kg)  04/22/17 225 lb (102.1 kg)  not a lot of time for self care  Wt is down 6 lb (cut back on alcohol helped)  Lost 8 lb by her scale  Working with a trainer 3 d per week / some pointers from a nutritionist  31.83 kg/m   Colon cancer screening - has not had a colonoscopy yet- she went for the pre procedure meeting  Will schedule it herself/had her ref   Flu shot - did not get this past fall   Mammogram 10/18- that came out ok /had a f/u MRI as well -clear   Hx of atypical ductal hyperplasia - lumpectomy    Dermatology- goes for surveillance - went recently /has f/ui 6 19 for re check of some spots Dr Delman Cheadle   Eye doctor on July 10    Self breast exam -no lumps   Pap 10/18 neg gyn  Tetanus shot 5/15  HIV screen 4/19 neg -neg with other std screening   Hyperlipidemia  Lab Results  Component Value Date   CHOL 242 (H) 06/04/2017   CHOL 254 (H) 02/15/2016   CHOL 196 02/25/2014   Lab Results  Component Value Date   HDL 77.10 06/04/2017   HDL 82.10 02/15/2016   HDL 75.20 02/25/2014   Lab Results  Component Value Date   LDLCALC 143 (H) 06/04/2017   LDLCALC 146 (H) 02/15/2016   LDLCALC 111 (H) 02/25/2014   Lab Results  Component Value Date   TRIG 110.0 06/04/2017   TRIG 131.0 02/15/2016   TRIG 50.0 02/25/2014   Lab Results  Component Value Date   CHOLHDL 3 06/04/2017   CHOLHDL 3 02/15/2016   CHOLHDL 3 02/25/2014   No results found for: LDLDIRECT ? If  high chol in family  Wants to re check in 6 mo - starting to eat better   Too much dairy/cheese/ / red meat  Can eat more fish   Other labs Results for orders placed or performed in visit on 06/04/17  TSH  Result Value Ref Range   TSH 1.87 0.35 - 4.50 uIU/mL  Lipid panel  Result Value Ref Range   Cholesterol 242 (H) 0 - 200 mg/dL   Triglycerides 110.0 0.0 - 149.0 mg/dL   HDL 77.10 >39.00 mg/dL   VLDL 22.0 0.0 - 40.0 mg/dL   LDL Cholesterol 143 (H) 0 - 99 mg/dL   Total CHOL/HDL Ratio 3    NonHDL 165.17   Comprehensive metabolic panel  Result Value Ref Range   Sodium 137 135 - 145 mEq/L   Potassium 4.4 3.5 - 5.1 mEq/L   Chloride 104 96 - 112 mEq/L   CO2 26 19 - 32 mEq/L   Glucose, Bld 114 (H) 70 - 99 mg/dL   BUN 19 6 - 23 mg/dL   Creatinine, Ser 0.84 0.40 - 1.20 mg/dL  Total Bilirubin 0.4 0.2 - 1.2 mg/dL   Alkaline Phosphatase 44 39 - 117 U/L   AST 27 0 - 37 U/L   ALT 23 0 - 35 U/L   Total Protein 7.0 6.0 - 8.3 g/dL   Albumin 4.0 3.5 - 5.2 g/dL   Calcium 9.0 8.4 - 10.5 mg/dL   GFR 75.91 >60.00 mL/min  CBC with Differential/Platelet  Result Value Ref Range   WBC 6.3 4.0 - 10.5 K/uL   RBC 4.02 3.87 - 5.11 Mil/uL   Hemoglobin 12.9 12.0 - 15.0 g/dL   HCT 38.1 36.0 - 46.0 %   MCV 94.6 78.0 - 100.0 fl   MCHC 33.9 30.0 - 36.0 g/dL   RDW 12.8 11.5 - 15.5 %   Platelets 314.0 150.0 - 400.0 K/uL   Neutrophils Relative % 55.0 43.0 - 77.0 %   Lymphocytes Relative 29.8 12.0 - 46.0 %   Monocytes Relative 11.4 3.0 - 12.0 %   Eosinophils Relative 3.2 0.0 - 5.0 %   Basophils Relative 0.6 0.0 - 3.0 %   Neutro Abs 3.5 1.4 - 7.7 K/uL   Lymphs Abs 1.9 0.7 - 4.0 K/uL   Monocytes Absolute 0.7 0.1 - 1.0 K/uL   Eosinophils Absolute 0.2 0.0 - 0.7 K/uL   Basophils Absolute 0.0 0.0 - 0.1 K/uL    Glucose 114 - fasting  Mother- prediabetic   Patient Active Problem List   Diagnosis Date Noted  . Elevated glucose level 06/13/2017  . Screen for STD (sexually transmitted disease)  04/22/2017  . Obese 04/22/2017  . Hyperlipidemia 03/01/2016  . Atypical ductal hyperplasia of right breast 01/12/2015  . Breast cancer screening, high risk patient 01/12/2015  . Family history- stomach cancer 08/11/2012  . Anxiety disorder 08/11/2012  . Routine general medical examination at a health care facility 08/05/2012  . Unspecified family circumstance 11/09/2010  . ALLERGIC RHINITIS 08/05/2006  . Asthma 08/05/2006  . SKIN CANCER, HX OF 08/05/2006   Past Medical History:  Diagnosis Date  . Allergic rhinitis   . Allergy   . Anxiety   . Asthma   . Basal cell carcinoma of ear    Left  . Basal cell carcinoma of forehead   . Benign paroxysmal positional vertigo    Past Surgical History:  Procedure Laterality Date  . BASAL CELL CARCINOMA EXCISION  9/08   left ear  . BREAST SURGERY    . CERVICAL CONE BIOPSY    . CESAREAN SECTION  12/00  . MELANOMA EXCISION     pre melanoma  . NEUROMA SURGERY  6/02   finger  . RADIOACTIVE SEED GUIDED EXCISIONAL BREAST BIOPSY Right 12/08/2014   Procedure: RADIOACTIVE SEED GUIDED EXCISIONAL BREAST BIOPSY;  Surgeon: Rolm Bookbinder, MD;  Location: [facility];  Service: General;  Laterality: Right;   Social History   Tobacco Use  . Smoking status: Never Smoker  . Smokeless tobacco: Never Used  Substance Use Topics  . Alcohol use: Yes    Alcohol/week: 0.0 oz    Comment: Occasional/ Social  . Drug use: No   Family History  Problem Relation Age of Onset  . Asthma Mother   . Stomach cancer Mother   . Heart failure Unknown        GM  . Stomach cancer Unknown        GF  . Breast cancer Maternal Grandmother 41  . Uterine cancer Maternal Grandmother   . Colon polyps Father   . Stomach cancer Maternal  Grandfather   . Healthy Sister    No Known Allergies Current Outpatient Medications on File Prior to Visit  Medication Sig Dispense Refill  . albuterol (PROVENTIL HFA;VENTOLIN HFA) 108 (90 Base) MCG/ACT inhaler Inhale  2 puffs into the lungs every 4 (four) hours as needed for wheezing or shortness of breath (cough, shortness of breath or wheezing.). 1 Inhaler 1  . cetirizine (ZYRTEC) 10 MG tablet Take 10 mg by mouth daily as needed.     . Fluticasone-Salmeterol (ADVAIR DISKUS) 100-50 MCG/DOSE AEPB INHALE ONE (1) PUFF EVERY 12 HOURS AS DIRECTED. RINSE MOUTH AFTER EACH USE. 60 each 5  . ibuprofen (ADVIL,MOTRIN) 200 MG tablet Take 800 mg by mouth every 6 (six) hours as needed for pain. Reported on 01/12/2015     No current facility-administered medications on file prior to visit.     Review of Systems  Constitutional: Negative for activity change, appetite change, fatigue, fever and unexpected weight change.  HENT: Positive for postnasal drip, rhinorrhea and voice change. Negative for congestion, ear pain, sinus pressure, sinus pain, sneezing and sore throat.   Eyes: Negative for pain, redness and visual disturbance.  Respiratory: Negative for cough, shortness of breath and wheezing.   Cardiovascular: Negative for chest pain and palpitations.  Gastrointestinal: Negative for abdominal pain, blood in stool, constipation and diarrhea.  Endocrine: Negative for polydipsia and polyuria.  Genitourinary: Negative for dysuria, frequency and urgency.  Musculoskeletal: Negative for arthralgias, back pain and myalgias.  Skin: Negative for pallor and rash.  Allergic/Immunologic: Negative for environmental allergies.  Neurological: Negative for dizziness, syncope and headaches.  Hematological: Negative for adenopathy. Does not bruise/bleed easily.  Psychiatric/Behavioral: Negative for decreased concentration and dysphoric mood. The patient is not nervous/anxious.        Objective:   Physical Exam  Constitutional: She appears well-developed and well-nourished. No distress.  obese and well appearing   HENT:  Head: Normocephalic and atraumatic.  Right Ear: External ear normal.  Left Ear: External ear normal.  Nose: Nose  normal.  Mouth/Throat: Oropharynx is clear and moist. No oropharyngeal exudate.  Voice is mildly hoarse  Scant clear pnd  Nares are clear   Eyes: Pupils are equal, round, and reactive to light. Conjunctivae and EOM are normal. No scleral icterus.  Neck: Normal range of motion. Neck supple. No JVD present. Carotid bruit is not present. No thyromegaly present.  Cardiovascular: Normal rate, regular rhythm, normal heart sounds and intact distal pulses. Exam reveals no gallop.  Pulmonary/Chest: Effort normal and breath sounds normal. No respiratory distress. She has no wheezes. She exhibits no tenderness. No breast tenderness, discharge or bleeding.  Abdominal: Soft. Bowel sounds are normal. She exhibits no distension, no abdominal bruit and no mass. There is no tenderness.  Genitourinary: No breast tenderness, discharge or bleeding.  Genitourinary Comments: Gyn does her breast/pelvic exam  Musculoskeletal: Normal range of motion. She exhibits no edema or tenderness.  Lymphadenopathy:    She has no cervical adenopathy.  Neurological: She is alert. She has normal reflexes. She displays normal reflexes. No cranial nerve deficit. She exhibits normal muscle tone. Coordination normal.  Skin: Skin is warm and dry. No rash noted. No erythema. No pallor.  Solar lentigines diffusely Mildly tanned  Psychiatric: She has a normal mood and affect.  Pleasant and talkative           Assessment & Plan:   Problem List Items Addressed This Visit      Other   Elevated glucose level  Fasting glucose 114- obese and fam hx of prediabetes disc imp of low glycemic diet and wt loss to prevent DM2  Will check A1C in 6 mo       Hyperlipidemia    Disc goals for lipids and reasons to control them Rev last labs with pt Rev low sat fat diet in detail  LDL in 140s  Would consider medication if no imp with diet  Making effort and will re check in 6 mo       Obese    Discussed how this problem influences  overall health and the risks it imposes  Reviewed plan for weight loss with lower calorie diet (via better food choices and also portion control or program like weight watchers) and exercise building up to or more than 30 minutes 5 days per week including some aerobic activity   She is starting a new program- excited to work on that       Routine general medical examination at a health care facility - Primary    Reviewed health habits including diet and exercise and skin cancer prevention Reviewed appropriate screening tests for age  Also reviewed health mt list, fam hx and immunization status , as well as social and family history   See HPI Labs reviewed  Starting diet/exercise for wt loss  Disc sun protection for skin cancer prevention

## 2017-06-13 NOTE — Patient Instructions (Addendum)
Please schedule your colonoscopy   Don't forget to get flu shot in the fall   For cholesterol Avoid red meat/ fried foods/ egg yolks/ fatty breakfast meats/ butter, cheese and high fat dairy/ and shellfish    To avoid diabetes - Try to get most of your carbohydrates from produce (with the exception of white potatoes)  Eat less bread/pasta/rice/snack foods/cereals/sweets and other items from the middle of the grocery store (processed carbs)   Let's schedule fasting labs in 6 months  Get some rest and take care of yourself

## 2017-06-13 NOTE — Assessment & Plan Note (Signed)
Disc goals for lipids and reasons to control them Rev last labs with pt Rev low sat fat diet in detail  LDL in 140s  Would consider medication if no imp with diet  Making effort and will re check in 6 mo

## 2017-07-10 ENCOUNTER — Telehealth: Payer: Self-pay

## 2017-07-10 ENCOUNTER — Ambulatory Visit: Payer: Self-pay | Admitting: Family Medicine

## 2017-07-10 NOTE — Telephone Encounter (Signed)
Pt here for appt but doctor was delayed coming to office; pt said has chronic cough with congestion. Pt has been using Advair but not using albuterol inhaler a lot; advised pt to use Advair and albuterol inhaler as well; offered pt an appt on 07/12/17 but pt is going out of town later today and will cb next wk if needs appt. [facility] needed. Pt said she will continue to try to lose wt. Pt in no distress. FYI to Dr Glori Bickers.

## 2017-07-10 NOTE — Telephone Encounter (Signed)
[place]

## 2017-07-17 DIAGNOSIS — H0100A Unspecified blepharitis right eye, upper and lower eyelids: Secondary | ICD-10-CM | POA: Diagnosis not present

## 2017-07-17 DIAGNOSIS — H0100B Unspecified blepharitis left eye, upper and lower eyelids: Secondary | ICD-10-CM | POA: Diagnosis not present

## 2017-07-30 DIAGNOSIS — D485 Neoplasm of uncertain behavior of skin: Secondary | ICD-10-CM | POA: Diagnosis not present

## 2017-07-30 DIAGNOSIS — L559 Sunburn, unspecified: Secondary | ICD-10-CM | POA: Diagnosis not present

## 2017-07-30 DIAGNOSIS — L570 Actinic keratosis: Secondary | ICD-10-CM | POA: Diagnosis not present

## 2017-07-30 DIAGNOSIS — C44519 Basal cell carcinoma of skin of other part of trunk: Secondary | ICD-10-CM | POA: Diagnosis not present

## 2017-08-06 ENCOUNTER — Other Ambulatory Visit: Payer: Self-pay | Admitting: Family Medicine

## 2017-08-06 DIAGNOSIS — J069 Acute upper respiratory infection, unspecified: Secondary | ICD-10-CM

## 2017-08-06 DIAGNOSIS — B9789 Other viral agents as the cause of diseases classified elsewhere: Principal | ICD-10-CM

## 2017-08-06 DIAGNOSIS — I8312 Varicose veins of left lower extremity with inflammation: Secondary | ICD-10-CM | POA: Diagnosis not present

## 2017-08-06 NOTE — Telephone Encounter (Deleted)
Electronic refill request. Albuterol Last office visit:

## 2017-08-09 DIAGNOSIS — I8312 Varicose veins of left lower extremity with inflammation: Secondary | ICD-10-CM | POA: Diagnosis not present

## 2017-08-12 DIAGNOSIS — I8312 Varicose veins of left lower extremity with inflammation: Secondary | ICD-10-CM | POA: Diagnosis not present

## 2017-08-22 DIAGNOSIS — C44519 Basal cell carcinoma of skin of other part of trunk: Secondary | ICD-10-CM | POA: Diagnosis not present

## 2017-09-05 DIAGNOSIS — I8312 Varicose veins of left lower extremity with inflammation: Secondary | ICD-10-CM | POA: Diagnosis not present

## 2017-10-02 DIAGNOSIS — I8312 Varicose veins of left lower extremity with inflammation: Secondary | ICD-10-CM | POA: Diagnosis not present

## 2017-10-16 DIAGNOSIS — I8312 Varicose veins of left lower extremity with inflammation: Secondary | ICD-10-CM | POA: Diagnosis not present

## 2017-11-06 DIAGNOSIS — I8312 Varicose veins of left lower extremity with inflammation: Secondary | ICD-10-CM | POA: Diagnosis not present

## 2017-11-06 DIAGNOSIS — M7981 Nontraumatic hematoma of soft tissue: Secondary | ICD-10-CM | POA: Diagnosis not present

## 2017-12-03 DIAGNOSIS — I8312 Varicose veins of left lower extremity with inflammation: Secondary | ICD-10-CM | POA: Diagnosis not present

## 2017-12-11 DIAGNOSIS — Z6832 Body mass index (BMI) 32.0-32.9, adult: Secondary | ICD-10-CM | POA: Diagnosis not present

## 2017-12-11 DIAGNOSIS — Z1231 Encounter for screening mammogram for malignant neoplasm of breast: Secondary | ICD-10-CM | POA: Diagnosis not present

## 2017-12-11 DIAGNOSIS — Z01419 Encounter for gynecological examination (general) (routine) without abnormal findings: Secondary | ICD-10-CM | POA: Diagnosis not present

## 2017-12-11 DIAGNOSIS — N912 Amenorrhea, unspecified: Secondary | ICD-10-CM | POA: Diagnosis not present

## 2017-12-17 ENCOUNTER — Other Ambulatory Visit: Payer: Self-pay | Admitting: Obstetrics and Gynecology

## 2017-12-17 DIAGNOSIS — Z803 Family history of malignant neoplasm of breast: Secondary | ICD-10-CM

## 2017-12-18 ENCOUNTER — Other Ambulatory Visit: Payer: Self-pay

## 2018-01-02 ENCOUNTER — Ambulatory Visit
Admission: RE | Admit: 2018-01-02 | Discharge: 2018-01-02 | Disposition: A | Discharge status: Home / Self Care | Payer: 59 | Source: Ambulatory Visit | Attending: Obstetrics and Gynecology | Admitting: Obstetrics and Gynecology

## 2018-01-02 DIAGNOSIS — N6320 Unspecified lump in the left breast, unspecified quadrant: Secondary | ICD-10-CM | POA: Diagnosis not present

## 2018-01-02 DIAGNOSIS — N6310 Unspecified lump in the right breast, unspecified quadrant: Secondary | ICD-10-CM | POA: Diagnosis not present

## 2018-01-02 DIAGNOSIS — Z803 Family history of malignant neoplasm of breast: Secondary | ICD-10-CM

## 2018-01-02 MED ORDER — GADOBUTROL 1 MMOL/ML IV SOLN
10.0000 mL | Freq: Once | INTRAVENOUS | Status: AC | PRN
  Administered 2018-01-02: 10 mL via INTRAVENOUS

## 2018-01-10 ENCOUNTER — Other Ambulatory Visit: Payer: Self-pay | Admitting: Obstetrics and Gynecology

## 2018-01-10 DIAGNOSIS — R9389 Abnormal findings on diagnostic imaging of other specified body structures: Secondary | ICD-10-CM

## 2018-01-14 DIAGNOSIS — I8311 Varicose veins of right lower extremity with inflammation: Secondary | ICD-10-CM | POA: Diagnosis not present

## 2018-01-15 ENCOUNTER — Other Ambulatory Visit: Payer: Self-pay | Admitting: Diagnostic Radiology

## 2018-01-15 ENCOUNTER — Ambulatory Visit
Admission: RE | Admit: 2018-01-15 | Discharge: 2018-01-15 | Disposition: A | Discharge status: Home / Self Care | Payer: 59 | Source: Ambulatory Visit | Attending: Obstetrics and Gynecology | Admitting: Obstetrics and Gynecology

## 2018-01-15 ENCOUNTER — Ambulatory Visit
Admission: RE | Admit: 2018-01-15 | Discharge: 2018-01-15 | Disposition: A | Discharge status: Home / Self Care | Payer: 59 | Source: Ambulatory Visit | Attending: Diagnostic Radiology | Admitting: Diagnostic Radiology

## 2018-01-15 ENCOUNTER — Other Ambulatory Visit: Payer: Self-pay | Admitting: Family Medicine

## 2018-01-15 DIAGNOSIS — R9389 Abnormal findings on diagnostic imaging of other specified body structures: Secondary | ICD-10-CM

## 2018-01-15 DIAGNOSIS — N63 Unspecified lump in unspecified breast: Secondary | ICD-10-CM

## 2018-01-15 DIAGNOSIS — D0511 Intraductal carcinoma in situ of right breast: Secondary | ICD-10-CM | POA: Diagnosis not present

## 2018-01-15 DIAGNOSIS — R928 Other abnormal and inconclusive findings on diagnostic imaging of breast: Secondary | ICD-10-CM | POA: Diagnosis not present

## 2018-01-15 MED ORDER — GADOBUTROL 1 MMOL/ML IV SOLN
10.0000 mL | Freq: Once | INTRAVENOUS | Status: AC | PRN
  Administered 2018-01-15: 10 mL via INTRAVENOUS

## 2018-01-21 ENCOUNTER — Other Ambulatory Visit: Payer: Self-pay

## 2018-01-21 DIAGNOSIS — D0511 Intraductal carcinoma in situ of right breast: Secondary | ICD-10-CM | POA: Diagnosis not present

## 2018-01-22 DIAGNOSIS — I8311 Varicose veins of right lower extremity with inflammation: Secondary | ICD-10-CM | POA: Diagnosis not present

## 2018-01-23 ENCOUNTER — Other Ambulatory Visit: Payer: Self-pay

## 2018-01-24 DIAGNOSIS — D0511 Intraductal carcinoma in situ of right breast: Secondary | ICD-10-CM | POA: Diagnosis not present

## 2018-01-28 ENCOUNTER — Other Ambulatory Visit: Payer: Self-pay | Admitting: General Surgery

## 2018-01-28 DIAGNOSIS — D0511 Intraductal carcinoma in situ of right breast: Secondary | ICD-10-CM

## 2018-02-17 ENCOUNTER — Ambulatory Visit: Payer: 59 | Admitting: Family Medicine

## 2018-02-17 ENCOUNTER — Encounter: Payer: Self-pay | Admitting: Family Medicine

## 2018-02-17 VITALS — BP 136/74 | HR 61 | Temp 98.6°F

## 2018-02-17 DIAGNOSIS — D0590 Unspecified type of carcinoma in situ of unspecified breast: Secondary | ICD-10-CM

## 2018-02-17 DIAGNOSIS — J209 Acute bronchitis, unspecified: Secondary | ICD-10-CM

## 2018-02-17 DIAGNOSIS — J40 Bronchitis, not specified as acute or chronic: Secondary | ICD-10-CM

## 2018-02-17 HISTORY — DX: Bronchitis, not specified as acute or chronic: J40

## 2018-02-17 MED ORDER — PREDNISONE 10 MG PO TABS: ORAL_TABLET | ORAL | 0 refills | Status: DC

## 2018-02-17 MED ORDER — HYDROCOD POLST-CPM POLST ER 10-8 MG/5ML PO SUER: 5.0000 mL | Freq: Two times a day (BID) | ORAL | 0 refills | Status: DC | PRN

## 2018-02-17 NOTE — Assessment & Plan Note (Signed)
S/p uri , suspect viral in pt with asthma  Px 20 mg prednisone taper  Also tussionex (has failed other cough tx in the past) Caution of sedation  Update if not starting to improve in a week or if worsening  - inst to call if inc prod cough or fever asap

## 2018-02-17 NOTE — Patient Instructions (Signed)
Drink lots of fluids and rest   Take prednisone as directed (low dose) Continue the advair and albuterol as needed   tussionex with caution of sedation   Update if not starting to improve in a week or if worsening   Alert [place] if short of breath or increased productive cough or wheeze

## 2018-02-17 NOTE — Assessment & Plan Note (Signed)
With family hx  Planning genetic testing  Also bilateral mastectomy next month  Overall doing well with diagnosis and plan

## 2018-02-17 NOTE — Progress Notes (Signed)
Subjective:    Patient ID: Nichole Arnold, female    DOB: January 25, 1965, 53 y.o.   MRN: [id number]  HPI Here for cough and congestion  Pulse ox 97% on RA  Started with a cold  Used otc meds and got better- almost better   Then bronchitis symptoms started over the weekend Now having cough- all night and now all day  Tired  Tight chest- unsure if wheezing   No fever  No sinus pain or ST   Cough is productive- ? If mucous has color   Takes delsym -no longer helping  Or mucinex DM     Has hx of asthma (advair and albuterol)   Flu vaccine   Was diag with stage 0 breast cancer  High risk  Planning double mastectomy with re construction next month  Also genetic testing   Patient Active Problem List   Diagnosis Date Noted  . Acute bronchitis 02/17/2018  . Breast cancer, stage 0 02/17/2018  . Elevated glucose level 06/13/2017  . Screen for STD (sexually transmitted disease) 04/22/2017  . Obese 04/22/2017  . Hyperlipidemia 03/01/2016  . Atypical ductal hyperplasia of right breast 01/12/2015  . Breast cancer screening, high risk patient 01/12/2015  . Family history- stomach cancer 08/11/2012  . Anxiety disorder 08/11/2012  . Routine general medical examination at a health care facility 08/05/2012  . Unspecified family circumstance 11/09/2010  . ALLERGIC RHINITIS 08/05/2006  . Asthma 08/05/2006  . SKIN CANCER, HX OF 08/05/2006   Past Medical History:  Diagnosis Date  . Allergic rhinitis   . Allergy   . Anxiety   . Asthma   . Basal cell carcinoma of ear    Left  . Basal cell carcinoma of forehead   . Benign paroxysmal positional vertigo    Past Surgical History:  Procedure Laterality Date  . BASAL CELL CARCINOMA EXCISION  9/08   left ear  . BREAST SURGERY    . CERVICAL CONE BIOPSY    . CESAREAN SECTION  12/00  . MELANOMA EXCISION     pre melanoma  . NEUROMA SURGERY  6/02   finger  . RADIOACTIVE SEED GUIDED EXCISIONAL BREAST BIOPSY Right 12/08/2014   Procedure: RADIOACTIVE SEED GUIDED EXCISIONAL BREAST BIOPSY;  Surgeon: Rolm Bookbinder, MD;  Location: [facility];  Service: General;  Laterality: Right;   Social History   Tobacco Use  . Smoking status: Never Smoker  . Smokeless tobacco: Never Used  Substance Use Topics  . Alcohol use: Yes    Alcohol/week: 0.0 standard drinks    Comment: Occasional/ Social  . Drug use: No   Family History  Problem Relation Age of Onset  . Asthma Mother   . Stomach cancer Mother   . Heart failure Unknown        GM  . Stomach cancer Unknown        GF  . Breast cancer Maternal Grandmother 79  . Uterine cancer Maternal Grandmother   . Colon polyps Father   . Stomach cancer Maternal Grandfather   . Healthy Sister    No Known Allergies Current Outpatient Medications on File Prior to Visit  Medication Sig Dispense Refill  . albuterol (PROVENTIL HFA;VENTOLIN HFA) 108 (90 Base) MCG/ACT inhaler Inhale 2 puffs into the lungs every 6 (six) hours as needed for wheezing or shortness of breath. 6.7 Inhaler 5  . cetirizine (ZYRTEC) 10 MG tablet Take 10 mg by mouth daily as needed.     . Fluticasone-Salmeterol (ADVAIR  DISKUS) 100-50 MCG/DOSE AEPB INHALE ONE (1) PUFF EVERY 12 HOURS AS DIRECTED. RINSE MOUTH AFTER EACH USE. 60 each 5  . ibuprofen (ADVIL,MOTRIN) 200 MG tablet Take 800 mg by mouth every 6 (six) hours as needed for pain. Reported on 01/12/2015     No current facility-administered medications on file prior to visit.     Review of Systems  Constitutional: Negative for activity change, appetite change, fatigue, fever and unexpected weight change.  HENT: Negative for congestion, ear pain, rhinorrhea, sinus pressure and sore throat.   Eyes: Negative for pain, redness and visual disturbance.  Respiratory: Positive for cough, chest tightness and wheezing. Negative for shortness of breath and stridor.   Cardiovascular: Negative for chest pain and palpitations.  Gastrointestinal: Negative  for abdominal pain, blood in stool, constipation and diarrhea.  Endocrine: Negative for polydipsia and polyuria.  Genitourinary: Negative for dysuria, frequency and urgency.  Musculoskeletal: Negative for arthralgias, back pain and myalgias.  Skin: Negative for pallor and rash.  Allergic/Immunologic: Negative for environmental allergies.  Neurological: Negative for dizziness, syncope and headaches.  Hematological: Negative for adenopathy. Does not bruise/bleed easily.  Psychiatric/Behavioral: Negative for decreased concentration and dysphoric mood. The patient is not nervous/anxious.        Objective:   Physical Exam Constitutional:      General: She is not in acute distress.    Appearance: Normal appearance. She is not ill-appearing.  HENT:     Head: Normocephalic and atraumatic.     Comments: No sinus tenderness    Right Ear: Tympanic membrane and ear canal normal.     Left Ear: Tympanic membrane and ear canal normal.     Nose: No congestion or rhinorrhea.     Mouth/Throat:     Mouth: Mucous membranes are moist.     Pharynx: Oropharynx is clear.  Eyes:     General: No scleral icterus.       Right eye: No discharge.        Left eye: No discharge.     Extraocular Movements: Extraocular movements intact.     Conjunctiva/sclera: Conjunctivae normal.     Pupils: Pupils are equal, round, and reactive to light.  Cardiovascular:     Rate and Rhythm: Normal rate and regular rhythm.     Heart sounds: Normal heart sounds.  Pulmonary:     Effort: Pulmonary effort is normal. No respiratory distress.     Breath sounds: No stridor. Wheezing and rhonchi present. No rales.     Comments: Few scattered rhonchi No rales  Wheeze only on forced exp No prolonged exp phase  Good air exchange Harsh dry cough Skin:    General: Skin is warm and dry.     Coloration: Skin is not pale.     Findings: No rash.  Neurological:     Mental Status: She is alert. Mental status is at baseline.    Psychiatric:        Mood and Affect: Mood normal.           Assessment & Plan:   Problem List Items Addressed This Visit      Respiratory   Acute bronchitis - Primary    S/p uri , suspect viral in pt with asthma  Px 20 mg prednisone taper  Also tussionex (has failed other cough tx in the past) Caution of sedation  Update if not starting to improve in a week or if worsening  - inst to call if inc prod cough or fever asap  Other   Breast cancer, stage 0    With family hx  Planning genetic testing  Also bilateral mastectomy next month  Overall doing well with diagnosis and plan       Relevant Medications   predniSONE (DELTASONE) 10 MG tablet

## 2018-03-02 ENCOUNTER — Other Ambulatory Visit: Payer: Self-pay | Admitting: Family Medicine

## 2018-03-06 ENCOUNTER — Other Ambulatory Visit: Payer: Self-pay

## 2018-03-06 ENCOUNTER — Encounter (HOSPITAL_BASED_OUTPATIENT_CLINIC_OR_DEPARTMENT_OTHER): Payer: Self-pay | Admitting: *Deleted

## 2018-03-06 NOTE — Progress Notes (Signed)
Reviewed patient and recent URI with Dr Janeice Robinson. Patient completed steroid taper, and denies any current symptoms.  OK to proceed as planned.  Patient will pick up pre surgery Ensure and CHG wash prior to surgery.

## 2018-03-07 DIAGNOSIS — D0511 Intraductal carcinoma in situ of right breast: Secondary | ICD-10-CM | POA: Diagnosis not present

## 2018-03-07 NOTE — H&P (Signed)
Subjective:     Patient ID: Nichole Arnold is a 53 y.o. female.  HPI  Here for follow up discussion breast reconstruction prior to planned bilateral mastectomies. History right breast excisional biopsy 2016, with ADH and ALH. Declined tamoxifen at that time. Presented following screening MRI showing right breast three focal areas NME spanning 6.0 x 3.6 cm in total. Additionally within the right breast in the retroareolar location there is a 7 mm enhancing mass. Three MR guided biopsies performed labeled outer central, UOQ, upper central all demonstrated intermediate grade DCIS, ER/PR+  Patient has elected for bilateral mastectomies.  Current 38 D. Desires smaller, states was B cup prior to pregnancy. Wt up 40 lbs over 2 years, stressors included divorce, taking over business. Lives with father, who is independent. Has boyfriend and friends to help after surgery.  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  Pulmonary/Chest: Effort normal and breath sounds normal.  Abdominal:  Moderate soft tissue volume   Grade 1 ptosis bilateral SN to nipple R 26 L 26 cm BW R 20 L 21 (CW 15 cm) Nipple to IMF R 10 L 9.5 cm    Assessment:     DCIS R breast ER+    Plan:      Plan bilateral NSM with immediate tissue expander acellular dermis reconstruction.  Reviewedincisions, drains, OR length, hospital stay and recovery, limitations. Discussed process of expansion and implant based risks including rupture, MRI surveillance for silicone implants, infection requiring surgery or removal, contracture. Reviewed risks mastectomy flap necrosis requiring additional surgery.  Discussed use of acellular dermis in reconstruction, cadaveric source, incorporation over several weeks, risk that if has seroma or infection can act as additional nidus for infection if not  incorporated.  Discussed prepectoral vs sub pectoral reconstruction. Discussed with patient and benefit of this is no animation deformity, may be less pain. Risk may be more visible rippling over upper poles, greater need of ADM. Reviewed pre pectoral would require larger amount acellular dermis, more drains. Discussed any type reconstruction also risks long term displacement implant and visible rippling. If prepectoral counseled I would recommend she be comfortable with silicone implants as more options that have less rippling. She agrees to prepectoral placement.  Reviewed reconstruction will be asensate and not stimulate. Reviewed additional risks including but not limited to risks mastectomy flap necrosis requiring additional surgery, seroma, hematoma, asymmetry, need to additional procedures, fat necrosis, DVT/PE, damage to adjacent structures, cardiopulmonary complications.  Rx for [facility] [place] given.Completed ASPS off label usage of ADM.  Irene Limbo, MD [facility] [phone], pin [phone]

## 2018-03-13 NOTE — Progress Notes (Signed)
Ensure pre surgery drink given with instructions to complete by 0600 dos, surgical scrub given with instructions, pt verbalized understanding.

## 2018-03-17 NOTE — Anesthesia Preprocedure Evaluation (Addendum)
Anesthesia Evaluation  Patient identified by MRN, date of birth, ID band Patient awake    Reviewed: Allergy & Precautions, NPO status , Patient's Chart, lab work & pertinent test results  Airway Mallampati: II  TM Distance: >3 FB Neck ROM: Full    Dental no notable dental hx. (+) Teeth Intact, Dental Advisory Given   Pulmonary asthma ,    Pulmonary exam normal breath sounds clear to auscultation       Cardiovascular negative cardio ROS Normal cardiovascular exam Rhythm:Regular Rate:Normal     Neuro/Psych PSYCHIATRIC DISORDERS Anxiety negative neurological ROS     GI/Hepatic negative GI ROS, Neg liver ROS,   Endo/Other  negative endocrine ROS  Renal/GU negative Renal ROS     Musculoskeletal   Abdominal   Peds  Hematology negative hematology ROS (+)   Anesthesia Other Findings   Reproductive/Obstetrics                            Anesthesia Physical Anesthesia Plan  ASA: II  Anesthesia Plan: General   Post-op Pain Management:  Regional for Post-op pain   Induction: Intravenous  PONV Risk Score and Plan: 3 and Dexamethasone, Ondansetron, Treatment may vary due to age or medical condition and Scopolamine patch - Pre-op  Airway Management Planned: Oral ETT  Additional Equipment:   Intra-op Plan:   Post-operative Plan: Extubation in OR  Informed Consent: I have reviewed the patients History and Physical, chart, labs and discussed the procedure including the risks, benefits and alternatives for the proposed anesthesia with the patient or authorized representative who has indicated his/her understanding and acceptance.     Dental advisory given  Plan Discussed with: CRNA  Anesthesia Plan Comments: (GA w Bilateral Pec blocks w Exparel)      Anesthesia Quick Evaluation

## 2018-03-18 ENCOUNTER — Other Ambulatory Visit: Payer: Self-pay | Admitting: Family Medicine

## 2018-03-18 ENCOUNTER — Ambulatory Visit (HOSPITAL_BASED_OUTPATIENT_CLINIC_OR_DEPARTMENT_OTHER): Payer: 59 | Admitting: Anesthesiology

## 2018-03-18 ENCOUNTER — Encounter (HOSPITAL_BASED_OUTPATIENT_CLINIC_OR_DEPARTMENT_OTHER): Payer: Self-pay | Admitting: *Deleted

## 2018-03-18 ENCOUNTER — Ambulatory Visit (HOSPITAL_BASED_OUTPATIENT_CLINIC_OR_DEPARTMENT_OTHER)
Admission: RE | Admit: 2018-03-18 | Discharge: 2018-03-19 | Disposition: A | Discharge status: Home / Self Care | Payer: 59 | Source: Ambulatory Visit | Attending: General Surgery | Admitting: General Surgery

## 2018-03-18 ENCOUNTER — Encounter (HOSPITAL_BASED_OUTPATIENT_CLINIC_OR_DEPARTMENT_OTHER)
Admission: RE | Disposition: A | Discharge status: Home / Self Care | Payer: Self-pay | Source: Ambulatory Visit | Attending: General Surgery

## 2018-03-18 ENCOUNTER — Other Ambulatory Visit: Payer: Self-pay

## 2018-03-18 ENCOUNTER — Encounter (HOSPITAL_COMMUNITY)
Admission: RE | Admit: 2018-03-18 | Discharge: 2018-03-18 | Disposition: A | Discharge status: Home / Self Care | Payer: 59 | Source: Ambulatory Visit | Attending: General Surgery | Admitting: General Surgery

## 2018-03-18 DIAGNOSIS — G8918 Other acute postprocedural pain: Secondary | ICD-10-CM | POA: Diagnosis not present

## 2018-03-18 DIAGNOSIS — Z4001 Encounter for prophylactic removal of breast: Secondary | ICD-10-CM | POA: Diagnosis not present

## 2018-03-18 DIAGNOSIS — J45909 Unspecified asthma, uncomplicated: Secondary | ICD-10-CM | POA: Insufficient documentation

## 2018-03-18 DIAGNOSIS — Z170 Estrogen receptor positive status [ER+]: Secondary | ICD-10-CM | POA: Diagnosis not present

## 2018-03-18 DIAGNOSIS — F419 Anxiety disorder, unspecified: Secondary | ICD-10-CM | POA: Diagnosis not present

## 2018-03-18 DIAGNOSIS — D0511 Intraductal carcinoma in situ of right breast: Secondary | ICD-10-CM | POA: Insufficient documentation

## 2018-03-18 DIAGNOSIS — Z79899 Other long term (current) drug therapy: Secondary | ICD-10-CM | POA: Diagnosis not present

## 2018-03-18 DIAGNOSIS — Z791 Long term (current) use of non-steroidal anti-inflammatories (NSAID): Secondary | ICD-10-CM | POA: Diagnosis not present

## 2018-03-18 DIAGNOSIS — Z803 Family history of malignant neoplasm of breast: Secondary | ICD-10-CM | POA: Insufficient documentation

## 2018-03-18 DIAGNOSIS — E785 Hyperlipidemia, unspecified: Secondary | ICD-10-CM | POA: Diagnosis not present

## 2018-03-18 DIAGNOSIS — Z809 Family history of malignant neoplasm, unspecified: Secondary | ICD-10-CM | POA: Insufficient documentation

## 2018-03-18 DIAGNOSIS — Z421 Encounter for breast reconstruction following mastectomy: Secondary | ICD-10-CM | POA: Diagnosis not present

## 2018-03-18 DIAGNOSIS — Z853 Personal history of malignant neoplasm of breast: Secondary | ICD-10-CM | POA: Diagnosis not present

## 2018-03-18 HISTORY — PX: BREAST RECONSTRUCTION WITH PLACEMENT OF TISSUE EXPANDER AND ALLODERM: SHX6805

## 2018-03-18 HISTORY — DX: Bronchitis, not specified as acute or chronic: J40

## 2018-03-18 HISTORY — PX: NIPPLE SPARING MASTECTOMY WITH SENTINEL LYMPH NODE BIOPSY: SHX6826

## 2018-03-18 SURGERY — NIPPLE SPARING MASTECTOMY WITH SENTINEL LYMPH NODE BIOPSY
Anesthesia: General | Site: Breast | Laterality: Bilateral

## 2018-03-18 MED ORDER — FENTANYL CITRATE (PF) 100 MCG/2ML IJ SOLN
INTRAMUSCULAR | Status: AC
  Filled 2018-03-18: qty 2

## 2018-03-18 MED ORDER — KETOROLAC TROMETHAMINE 15 MG/ML IJ SOLN
INTRAMUSCULAR | Status: AC
  Filled 2018-03-18: qty 1

## 2018-03-18 MED ORDER — MIDAZOLAM HCL 2 MG/2ML IJ SOLN
INTRAMUSCULAR | Status: AC
  Filled 2018-03-18: qty 2

## 2018-03-18 MED ORDER — LIDOCAINE HCL (CARDIAC) PF 100 MG/5ML IV SOSY
PREFILLED_SYRINGE | INTRAVENOUS | Status: DC | PRN
  Administered 2018-03-18: 100 mg via INTRAVENOUS

## 2018-03-18 MED ORDER — GABAPENTIN 100 MG PO CAPS
ORAL_CAPSULE | ORAL | Status: AC
  Filled 2018-03-18: qty 1

## 2018-03-18 MED ORDER — LIDOCAINE 2% (20 MG/ML) 5 ML SYRINGE
INTRAMUSCULAR | Status: AC
  Filled 2018-03-18: qty 20

## 2018-03-18 MED ORDER — SODIUM CHLORIDE 0.9 % IV SOLN
INTRAVENOUS | Status: DC | PRN
  Administered 2018-03-18: 1000 mL

## 2018-03-18 MED ORDER — DEXAMETHASONE SODIUM PHOSPHATE 4 MG/ML IJ SOLN
INTRAMUSCULAR | Status: DC | PRN
  Administered 2018-03-18: 10 mg via INTRAVENOUS

## 2018-03-18 MED ORDER — PHENYLEPHRINE 40 MCG/ML (10ML) SYRINGE FOR IV PUSH (FOR BLOOD PRESSURE SUPPORT)
PREFILLED_SYRINGE | INTRAVENOUS | Status: AC
  Filled 2018-03-18: qty 20

## 2018-03-18 MED ORDER — ONDANSETRON 4 MG PO TBDP: 4.0000 mg | ORAL_TABLET | Freq: Four times a day (QID) | ORAL | Status: DC | PRN

## 2018-03-18 MED ORDER — KETOROLAC TROMETHAMINE 15 MG/ML IJ SOLN: 15.0000 mg | Freq: Four times a day (QID) | INTRAMUSCULAR | Status: DC | PRN

## 2018-03-18 MED ORDER — PROPOFOL 10 MG/ML IV BOLUS
INTRAVENOUS | Status: AC
  Filled 2018-03-18: qty 20

## 2018-03-18 MED ORDER — CEFAZOLIN SODIUM-DEXTROSE 2-4 GM/100ML-% IV SOLN
2.0000 g | Freq: Three times a day (TID) | INTRAVENOUS | Status: AC
  Administered 2018-03-18 – 2018-03-19 (×2): 2 g via INTRAVENOUS
  Filled 2018-03-18 (×2): qty 100

## 2018-03-18 MED ORDER — ONDANSETRON HCL 4 MG/2ML IJ SOLN
INTRAMUSCULAR | Status: AC
  Filled 2018-03-18: qty 2

## 2018-03-18 MED ORDER — CHLORHEXIDINE GLUCONATE CLOTH 2 % EX PADS: 6.0000 | MEDICATED_PAD | Freq: Once | CUTANEOUS | Status: DC

## 2018-03-18 MED ORDER — MOMETASONE FURO-FORMOTEROL FUM 100-5 MCG/ACT IN AERO: 2.0000 | INHALATION_SPRAY | Freq: Two times a day (BID) | RESPIRATORY_TRACT | Status: DC

## 2018-03-18 MED ORDER — OXYCODONE HCL 5 MG PO TABS: 5.0000 mg | ORAL_TABLET | ORAL | 0 refills | Status: DC | PRN

## 2018-03-18 MED ORDER — MEPERIDINE HCL 25 MG/ML IJ SOLN: 6.2500 mg | INTRAMUSCULAR | Status: DC | PRN

## 2018-03-18 MED ORDER — ENSURE PRE-SURGERY PO LIQD: 296.0000 mL | Freq: Once | ORAL | Status: DC

## 2018-03-18 MED ORDER — POVIDONE-IODINE 10 % EX SOLN
CUTANEOUS | Status: DC | PRN
  Administered 2018-03-18: 1 via TOPICAL

## 2018-03-18 MED ORDER — OXYCODONE HCL 5 MG/5ML PO SOLN: 5.0000 mg | Freq: Once | ORAL | Status: DC | PRN

## 2018-03-18 MED ORDER — ACETAMINOPHEN 500 MG PO TABS
ORAL_TABLET | ORAL | Status: AC
  Filled 2018-03-18: qty 2

## 2018-03-18 MED ORDER — FENTANYL CITRATE (PF) 100 MCG/2ML IJ SOLN
50.0000 ug | INTRAMUSCULAR | Status: AC | PRN
  Administered 2018-03-18: 100 ug via INTRAVENOUS
  Administered 2018-03-18: 25 ug via INTRAVENOUS
  Administered 2018-03-18: 100 ug via INTRAVENOUS

## 2018-03-18 MED ORDER — HYDROMORPHONE HCL 1 MG/ML IJ SOLN
INTRAMUSCULAR | Status: DC | PRN
  Administered 2018-03-18: 0.5 mg via INTRAVENOUS

## 2018-03-18 MED ORDER — EPHEDRINE SULFATE 50 MG/ML IJ SOLN
INTRAMUSCULAR | Status: DC | PRN
  Administered 2018-03-18: 15 mg via INTRAVENOUS

## 2018-03-18 MED ORDER — ROCURONIUM BROMIDE 100 MG/10ML IV SOLN
INTRAVENOUS | Status: DC | PRN
  Administered 2018-03-18: 50 mg via INTRAVENOUS
  Administered 2018-03-18 (×2): 20 mg via INTRAVENOUS

## 2018-03-18 MED ORDER — ONDANSETRON HCL 4 MG/2ML IJ SOLN: 4.0000 mg | Freq: Four times a day (QID) | INTRAMUSCULAR | Status: DC | PRN

## 2018-03-18 MED ORDER — SCOPOLAMINE 1 MG/3DAYS TD PT72: 1.0000 | MEDICATED_PATCH | Freq: Once | TRANSDERMAL | Status: DC | PRN

## 2018-03-18 MED ORDER — ACETAMINOPHEN 500 MG PO TABS
1000.0000 mg | ORAL_TABLET | Freq: Four times a day (QID) | ORAL | Status: DC
  Administered 2018-03-18 – 2018-03-19 (×3): 1000 mg via ORAL
  Filled 2018-03-18 (×3): qty 2

## 2018-03-18 MED ORDER — SIMETHICONE 80 MG PO CHEW: 40.0000 mg | CHEWABLE_TABLET | Freq: Four times a day (QID) | ORAL | Status: DC | PRN

## 2018-03-18 MED ORDER — OXYCODONE HCL 5 MG PO TABS: 5.0000 mg | ORAL_TABLET | Freq: Once | ORAL | Status: DC | PRN

## 2018-03-18 MED ORDER — OXYCODONE HCL 5 MG PO TABS
5.0000 mg | ORAL_TABLET | ORAL | Status: DC | PRN
  Administered 2018-03-18 – 2018-03-19 (×2): 5 mg via ORAL
  Filled 2018-03-18 (×2): qty 1

## 2018-03-18 MED ORDER — KETOROLAC TROMETHAMINE 30 MG/ML IJ SOLN
30.0000 mg | Freq: Once | INTRAMUSCULAR | Status: AC | PRN
  Administered 2018-03-18: 15 mg via INTRAVENOUS

## 2018-03-18 MED ORDER — PROPOFOL 500 MG/50ML IV EMUL
INTRAVENOUS | Status: DC | PRN
  Administered 2018-03-18: 25 ug/kg/min via INTRAVENOUS

## 2018-03-18 MED ORDER — ACETAMINOPHEN 500 MG PO TABS
1000.0000 mg | ORAL_TABLET | ORAL | Status: AC
  Administered 2018-03-18: 1000 mg via ORAL

## 2018-03-18 MED ORDER — SODIUM CHLORIDE 0.9 % IV SOLN
INTRAVENOUS | Status: DC
  Administered 2018-03-18: 16:00:00 via INTRAVENOUS

## 2018-03-18 MED ORDER — HYDROMORPHONE HCL 1 MG/ML IJ SOLN
0.2500 mg | INTRAMUSCULAR | Status: DC | PRN
  Administered 2018-03-18 (×2): 0.5 mg via INTRAVENOUS

## 2018-03-18 MED ORDER — KETOROLAC TROMETHAMINE 15 MG/ML IJ SOLN
15.0000 mg | INTRAMUSCULAR | Status: AC
  Administered 2018-03-18: 15 mg via INTRAVENOUS

## 2018-03-18 MED ORDER — HYDROMORPHONE HCL 1 MG/ML IJ SOLN
INTRAMUSCULAR | Status: AC
  Filled 2018-03-18: qty 1

## 2018-03-18 MED ORDER — DEXAMETHASONE SODIUM PHOSPHATE 10 MG/ML IJ SOLN
INTRAMUSCULAR | Status: AC
  Filled 2018-03-18: qty 1

## 2018-03-18 MED ORDER — LORATADINE 10 MG PO TABS: 10.0000 mg | ORAL_TABLET | Freq: Every day | ORAL | Status: DC | PRN

## 2018-03-18 MED ORDER — BUPIVACAINE-EPINEPHRINE (PF) 0.25% -1:200000 IJ SOLN
INTRAMUSCULAR | Status: DC | PRN
  Administered 2018-03-18 (×2): 20 mL

## 2018-03-18 MED ORDER — HYDROMORPHONE HCL 1 MG/ML IJ SOLN
INTRAMUSCULAR | Status: AC
  Filled 2018-03-18: qty 0.5

## 2018-03-18 MED ORDER — CEFAZOLIN SODIUM-DEXTROSE 2-4 GM/100ML-% IV SOLN
INTRAVENOUS | Status: AC
  Filled 2018-03-18: qty 100

## 2018-03-18 MED ORDER — PROPOFOL 10 MG/ML IV BOLUS
INTRAVENOUS | Status: DC | PRN
  Administered 2018-03-18: 200 mg via INTRAVENOUS

## 2018-03-18 MED ORDER — KETOROLAC TROMETHAMINE 30 MG/ML IJ SOLN
INTRAMUSCULAR | Status: AC
  Filled 2018-03-18: qty 1

## 2018-03-18 MED ORDER — SUGAMMADEX SODIUM 200 MG/2ML IV SOLN
INTRAVENOUS | Status: DC | PRN
  Administered 2018-03-18: 200 mg via INTRAVENOUS

## 2018-03-18 MED ORDER — PHENYLEPHRINE HCL 10 MG/ML IJ SOLN
INTRAMUSCULAR | Status: DC | PRN
  Administered 2018-03-18 (×3): 120 ug via INTRAVENOUS

## 2018-03-18 MED ORDER — METHOCARBAMOL 500 MG PO TABS
500.0000 mg | ORAL_TABLET | Freq: Three times a day (TID) | ORAL | Status: DC
  Administered 2018-03-18 (×2): 500 mg via ORAL
  Filled 2018-03-18 (×2): qty 1

## 2018-03-18 MED ORDER — ONDANSETRON HCL 4 MG/2ML IJ SOLN
INTRAMUSCULAR | Status: AC
  Filled 2018-03-18: qty 4

## 2018-03-18 MED ORDER — SULFAMETHOXAZOLE-TRIMETHOPRIM 800-160 MG PO TABS: 1.0000 | ORAL_TABLET | Freq: Two times a day (BID) | ORAL | 0 refills | Status: DC

## 2018-03-18 MED ORDER — METHOCARBAMOL 500 MG PO TABS: 500.0000 mg | ORAL_TABLET | Freq: Three times a day (TID) | ORAL | 0 refills | Status: DC | PRN

## 2018-03-18 MED ORDER — GABAPENTIN 100 MG PO CAPS
100.0000 mg | ORAL_CAPSULE | ORAL | Status: AC
  Administered 2018-03-18: 100 mg via ORAL

## 2018-03-18 MED ORDER — PROPOFOL 10 MG/ML IV BOLUS
INTRAVENOUS | Status: AC
  Filled 2018-03-18: qty 40

## 2018-03-18 MED ORDER — MORPHINE SULFATE (PF) 4 MG/ML IV SOLN: 1.0000 mg | INTRAVENOUS | Status: DC | PRN

## 2018-03-18 MED ORDER — BUPIVACAINE LIPOSOME 1.3 % IJ SUSP
INTRAMUSCULAR | Status: DC | PRN
  Administered 2018-03-18 (×2): 10 mL

## 2018-03-18 MED ORDER — MIDAZOLAM HCL 2 MG/2ML IJ SOLN
1.0000 mg | INTRAMUSCULAR | Status: DC | PRN
  Administered 2018-03-18 (×2): 2 mg via INTRAVENOUS

## 2018-03-18 MED ORDER — TECHNETIUM TC 99M SULFUR COLLOID FILTERED
1.0000 | Freq: Once | INTRAVENOUS | Status: AC | PRN
  Administered 2018-03-18: 1 via INTRADERMAL

## 2018-03-18 MED ORDER — LACTATED RINGERS IV SOLN
INTRAVENOUS | Status: DC
  Administered 2018-03-18 (×3): via INTRAVENOUS

## 2018-03-18 MED ORDER — ALBUTEROL SULFATE HFA 108 (90 BASE) MCG/ACT IN AERS: 2.0000 | INHALATION_SPRAY | Freq: Four times a day (QID) | RESPIRATORY_TRACT | Status: DC | PRN

## 2018-03-18 MED ORDER — EPHEDRINE 5 MG/ML INJ
INTRAVENOUS | Status: AC
  Filled 2018-03-18: qty 20

## 2018-03-18 MED ORDER — ONDANSETRON HCL 4 MG/2ML IJ SOLN
4.0000 mg | Freq: Once | INTRAMUSCULAR | Status: AC | PRN
  Administered 2018-03-18: 4 mg via INTRAVENOUS

## 2018-03-18 MED ORDER — CEFAZOLIN SODIUM-DEXTROSE 2-4 GM/100ML-% IV SOLN
2.0000 g | INTRAVENOUS | Status: AC
  Administered 2018-03-18: 2 g via INTRAVENOUS

## 2018-03-18 MED ORDER — PROPOFOL 500 MG/50ML IV EMUL
INTRAVENOUS | Status: AC
  Filled 2018-03-18: qty 100

## 2018-03-18 SURGICAL SUPPLY — 100 items
ADH SKN CLS APL DERMABOND .7 (GAUZE/BANDAGES/DRESSINGS) ×1
ALLOGRAFT PERF 16X20 1.6+/-0.4 (Tissue) ×2 IMPLANT
APL PRP STRL LF DISP 70% ISPRP (MISCELLANEOUS) ×2
APL SKNCLS STERI-STRIP NONHPOA (GAUZE/BANDAGES/DRESSINGS)
APPLIER CLIP 11 MED OPEN (CLIP)
APPLIER CLIP 9.375 MED OPEN (MISCELLANEOUS) ×2
APR CLP MED 11 20 MLT OPN (CLIP)
APR CLP MED 9.3 20 MLT OPN (MISCELLANEOUS) ×1
BAG DECANTER FOR FLEXI CONT (MISCELLANEOUS) ×2 IMPLANT
BENZOIN TINCTURE PRP APPL 2/3 (GAUZE/BANDAGES/DRESSINGS) IMPLANT
BINDER BREAST LRG (GAUZE/BANDAGES/DRESSINGS) IMPLANT
BINDER BREAST MEDIUM (GAUZE/BANDAGES/DRESSINGS) IMPLANT
BINDER BREAST XLRG (GAUZE/BANDAGES/DRESSINGS) IMPLANT
BINDER BREAST XXLRG (GAUZE/BANDAGES/DRESSINGS) IMPLANT
BIOPATCH RED 1 DISK 7.0 (GAUZE/BANDAGES/DRESSINGS) IMPLANT
BLADE CLIPPER SURG (BLADE) IMPLANT
BLADE HEX COATED 2.75 (ELECTRODE) IMPLANT
BLADE SURG 10 STRL SS (BLADE) ×3 IMPLANT
BLADE SURG 15 STRL LF DISP TIS (BLADE) ×1 IMPLANT
BLADE SURG 15 STRL SS (BLADE) ×2
BNDG GAUZE ELAST 4 BULKY (GAUZE/BANDAGES/DRESSINGS) ×2 IMPLANT
CANISTER SUCT 1200ML W/VALVE (MISCELLANEOUS) ×2 IMPLANT
CHLORAPREP W/TINT 26 (MISCELLANEOUS) ×3 IMPLANT
CLIP APPLIE 11 MED OPEN (CLIP) IMPLANT
CLIP APPLIE 9.375 MED OPEN (MISCELLANEOUS) IMPLANT
CLIP VESOCCLUDE SM WIDE 6/CT (CLIP) IMPLANT
COUNTER NEEDLE 1200 MAGNETIC (NEEDLE) ×1 IMPLANT
COVER BACK TABLE 60X90IN (DRAPES) ×2 IMPLANT
COVER MAYO STAND STRL (DRAPES) ×4 IMPLANT
COVER PROBE W GEL 5X96 (DRAPES) ×2 IMPLANT
COVER WAND RF STERILE (DRAPES) IMPLANT
DECANTER SPIKE VIAL GLASS SM (MISCELLANEOUS) IMPLANT
DERMABOND ADVANCED (GAUZE/BANDAGES/DRESSINGS) ×1
DERMABOND ADVANCED .7 DNX12 (GAUZE/BANDAGES/DRESSINGS) ×1 IMPLANT
DRAIN CHANNEL 15F RND FF W/TCR (WOUND CARE) ×2 IMPLANT
DRAIN CHANNEL 19F RND (DRAIN) ×3 IMPLANT
DRAPE TOP ARMCOVERS (MISCELLANEOUS) ×2 IMPLANT
DRAPE U-SHAPE 76X120 STRL (DRAPES) ×2 IMPLANT
DRAPE UTILITY XL STRL (DRAPES) ×2 IMPLANT
DRSG PAD ABDOMINAL 8X10 ST (GAUZE/BANDAGES/DRESSINGS) ×4 IMPLANT
DRSG TEGADERM 4X10 (GAUZE/BANDAGES/DRESSINGS) ×4 IMPLANT
DRSG TEGADERM 4X4.75 (GAUZE/BANDAGES/DRESSINGS) IMPLANT
ELECT BLADE 4.0 EZ CLEAN MEGAD (MISCELLANEOUS) ×2
ELECT COATED BLADE 2.86 ST (ELECTRODE) ×2 IMPLANT
ELECT REM PT RETURN 9FT ADLT (ELECTROSURGICAL) ×2
ELECTRODE BLDE 4.0 EZ CLN MEGD (MISCELLANEOUS) ×1 IMPLANT
ELECTRODE REM PT RTRN 9FT ADLT (ELECTROSURGICAL) ×1 IMPLANT
EVACUATOR SILICONE 100CC (DRAIN) ×5 IMPLANT
EXPANDER TISSUE FV FOURTE 500 (Prosthesis & Implant Plastic) IMPLANT
GAUZE SPONGE 4X4 12PLY STRL LF (GAUZE/BANDAGES/DRESSINGS) IMPLANT
GLOVE BIO SURGEON STRL SZ 6 (GLOVE) ×6 IMPLANT
GLOVE BIO SURGEON STRL SZ7 (GLOVE) ×5 IMPLANT
GLOVE BIOGEL PI IND STRL 7.5 (GLOVE) ×1 IMPLANT
GLOVE BIOGEL PI INDICATOR 7.5 (GLOVE) ×3
GOWN STRL REUS W/ TWL LRG LVL3 (GOWN DISPOSABLE) ×4 IMPLANT
GOWN STRL REUS W/ TWL XL LVL3 (GOWN DISPOSABLE) IMPLANT
GOWN STRL REUS W/TWL LRG LVL3 (GOWN DISPOSABLE) ×8
GOWN STRL REUS W/TWL XL LVL3 (GOWN DISPOSABLE) ×2
ILLUMINATOR WAVEGUIDE N/F (MISCELLANEOUS) IMPLANT
KIT FILL SYSTEM UNIVERSAL (SET/KITS/TRAYS/PACK) IMPLANT
LIGHT WAVEGUIDE WIDE FLAT (MISCELLANEOUS) ×2 IMPLANT
MARKER SKIN DUAL TIP RULER LAB (MISCELLANEOUS) IMPLANT
NDL HYPO 25X1 1.5 SAFETY (NEEDLE) IMPLANT
NDL SAFETY ECLIPSE 18X1.5 (NEEDLE) IMPLANT
NEEDLE HYPO 18GX1.5 SHARP (NEEDLE)
NEEDLE HYPO 25X1 1.5 SAFETY (NEEDLE) IMPLANT
NS IRRIG 1000ML POUR BTL (IV SOLUTION) ×2 IMPLANT
PACK BASIN DAY SURGERY FS (CUSTOM PROCEDURE TRAY) ×2 IMPLANT
PENCIL BUTTON HOLSTER BLD 10FT (ELECTRODE) ×2 IMPLANT
PIN SAFETY STERILE (MISCELLANEOUS) ×2 IMPLANT
PUNCH BIOPSY DERMAL 4MM (MISCELLANEOUS) IMPLANT
SHEET MEDIUM DRAPE 40X70 STRL (DRAPES) ×3 IMPLANT
SLEEVE SCD COMPRESS KNEE MED (MISCELLANEOUS) ×2 IMPLANT
SPONGE LAP 18X18 RF (DISPOSABLE) ×6 IMPLANT
SPONGE LAP 4X18 RFD (DISPOSABLE) IMPLANT
STAPLER VISISTAT 35W (STAPLE) ×2 IMPLANT
STRIP CLOSURE SKIN 1/2X4 (GAUZE/BANDAGES/DRESSINGS) IMPLANT
SUT CHROMIC 4 0 PS 2 18 (SUTURE) ×6 IMPLANT
SUT ETHILON 2 0 FS 18 (SUTURE) ×3 IMPLANT
SUT MNCRL AB 4-0 PS2 18 (SUTURE) ×3 IMPLANT
SUT SILK 2 0 SH (SUTURE) ×1 IMPLANT
SUT VIC AB 3-0 54X BRD REEL (SUTURE) IMPLANT
SUT VIC AB 3-0 BRD 54 (SUTURE)
SUT VIC AB 3-0 SH 27 (SUTURE) ×4
SUT VIC AB 3-0 SH 27X BRD (SUTURE) IMPLANT
SUT VICRYL 0 CT-2 (SUTURE) ×4 IMPLANT
SUT VICRYL 3-0 CR8 SH (SUTURE) ×2 IMPLANT
SUT VICRYL 4-0 PS2 18IN ABS (SUTURE) ×2 IMPLANT
SUT VLOC 180 0 24IN GS25 (SUTURE) ×2 IMPLANT
SYR 50ML LL SCALE MARK (SYRINGE) ×2 IMPLANT
SYR BULB IRRIGATION 50ML (SYRINGE) ×3 IMPLANT
SYR CONTROL 10ML LL (SYRINGE) IMPLANT
TAPE MEASURE VINYL STERILE (MISCELLANEOUS) IMPLANT
TISSUE EXPNDR FV FOURTE 500 (Prosthesis & Implant Plastic) ×4 IMPLANT
TOWEL GREEN STERILE FF (TOWEL DISPOSABLE) ×5 IMPLANT
TRAY DSU PREP LF (CUSTOM PROCEDURE TRAY) ×2 IMPLANT
TRAY FOLEY W/BAG SLVR 14FR LF (SET/KITS/TRAYS/PACK) ×1 IMPLANT
TUBE CONNECTING 20X1/4 (TUBING) ×2 IMPLANT
UNDERPAD 30X30 (UNDERPADS AND DIAPERS) ×4 IMPLANT
YANKAUER SUCT BULB TIP NO VENT (SUCTIONS) ×2 IMPLANT

## 2018-03-18 NOTE — Anesthesia Procedure Notes (Signed)
Anesthesia Regional Block: Pectoralis block   Pre-Anesthetic Checklist: ,, timeout performed, Correct Patient, Correct Site, Correct Laterality, Correct Procedure, Correct Position, site marked, Risks and benefits discussed,  Surgical consent,  Pre-op evaluation,  At surgeon's request and post-op pain management  Laterality: Right  Prep: chloraprep       Needles:  Injection technique: Single-shot  Needle Type: Echogenic Needle     Needle Length: 9cm  Needle Gauge: 21     Additional Needles:   Procedures:,,,, ultrasound used (permanent image in chart),,,,  Narrative:  Start time: 03/18/2018 9:26 AM End time: 03/18/2018 9:33 AM Injection made incrementally with aspirations every 5 mL.  Performed by: Personally  Anesthesiologist: Barnet Glasgow, MD  Additional Notes: Block assessed. Patient tolerated procedure well.

## 2018-03-18 NOTE — Progress Notes (Signed)
Assisted Dr. Valma Cava with right, left, ultrasound guided, pectoralis block. Side rails up, monitors on throughout procedure. See vital signs in flow sheet. Tolerated Procedure well.

## 2018-03-18 NOTE — Transfer of Care (Signed)
Immediate Anesthesia Transfer of Care Note  Patient: DEVETTA HAGENOW  Procedure(s) Performed: RIGHT NIPPLE SPARING MASTECTOMY WITH RIGHT AXILLARY SENTINEL LYMPH NODE BIOPSY AND LEFT RISK REDUCING NIPPLE SPARING MASTECTOMY (Bilateral Breast) BILATERAL BREAST RECONSTRUCTION WITH PLACEMENT OF TISSUE EXPANDER AND ALLODERM (Bilateral Breast)  Patient Location: PACU  Anesthesia Type:General  Level of Consciousness: sedated  Airway & Oxygen Therapy: Patient Spontanous Breathing and Patient connected to face mask oxygen  Post-op Assessment: Report given to RN and Post -op Vital signs reviewed and stable  Post vital signs: Reviewed and stable  Last Vitals:  Vitals Value Taken Time  BP    Temp    Pulse 77 03/18/2018  2:20 PM  Resp 16 03/18/2018  2:20 PM  SpO2 99 % 03/18/2018  2:20 PM  Vitals shown include unvalidated device data.  Last Pain:  Vitals:   03/18/18 0857  TempSrc: Oral  PainSc: 0-No pain      Patients Stated Pain Goal: 0 (27/06/23 7628)  Complications: No apparent anesthesia complications

## 2018-03-18 NOTE — Interval H&P Note (Signed)
History and Physical Interval Note:  03/18/2018 9:44 AM  Nichole Arnold  has presented today for surgery, with the diagnosis of BREAST CANCER.  The various methods of treatment have been discussed with the patient and family. After consideration of risks, benefits and other options for treatment, the patient has consented to  Procedure(s): RIGHT NIPPLE SPARING MASTECTOMY WITH RIGHT AXILLARY SENTINEL LYMPH NODE BIOPSY AND LEFT RISK REDUCING NIPPLE SPARING MASTECTOMY (Bilateral) BILATERAL BREAST RECONSTRUCTION WITH PLACEMENT OF TISSUE EXPANDER AND ALLODERM (Bilateral) as a surgical intervention.  The patient's history has been reviewed, patient examined, no change in status, stable for surgery.  I have reviewed the patient's chart and labs.  Questions were answered to the patient's satisfaction.     Arnoldo Hooker Orissa Arreaga

## 2018-03-18 NOTE — Anesthesia Procedure Notes (Signed)
Procedure Name: Intubation Date/Time: 03/18/2018 10:28 AM Performed by: Signe Colt, CRNA Pre-anesthesia Checklist: Patient identified, Emergency Drugs available, Suction available and Patient being monitored Patient Re-evaluated:Patient Re-evaluated prior to induction Oxygen Delivery Method: Circle system utilized Preoxygenation: Pre-oxygenation with 100% oxygen Induction Type: IV induction Ventilation: Mask ventilation without difficulty Laryngoscope Size: Mac and 3 Grade View: Grade I Tube type: Oral Tube size: 7.0 mm Number of attempts: 1 Airway Equipment and Method: Stylet and Oral airway Placement Confirmation: ETT inserted through vocal cords under direct vision,  positive ETCO2 and breath sounds checked- equal and bilateral Secured at: 21 cm Tube secured with: Tape Dental Injury: Teeth and Oropharynx as per pre-operative assessment

## 2018-03-18 NOTE — H&P (Signed)
53 yof referred by Dr Radene Knee for new dcis. she has history in 2016 of excisional biopsy on right of adh and alh. refused tamoxifen then due to concern for side effects. she had screening mri due to high risk with suspicious segmental nme involving the upper outer and lateral aspect of right breast. there is indeterminate 7 mm mass retroareolar. she has undergone three biopsies of the larger nme area in the right breast. nodes and left breast are negative. all of these are er/pr pos dcis. the clips are at least 4 cm apart on the clip films. she has no mass or discharge. she comes in today requesting bilateral mastectomies. she has fh of breast cancer in [facility] at age 8 and gastric cancer in her mom   Past Surgical History  Breast Biopsy  Right. multiple Breast Mass; Local Excision  Right. Cesarean Section - 1  Oral Surgery   Diagnostic Studies History  Colonoscopy  never Mammogram  within last year Pap Smear  1-5 years ago  Allergies  No Known Drug Allergies  Allergies Reconciled   Medication History Albuterol Sulfate ((2.5 MG/3ML)0.083% Nebulized Soln, Inhalation) Active. ALPRAZolam (0.25MG  Tablet, Oral prn) Active. ZyrTEC Allergy (10MG  Capsule, Oral prn) Active. Advair Diskus (100-50MCG/DOSE Aero Pow Br Act, Inhalation prn) Active. Ibuprofen (200MG  Tablet, Oral prn) Active. Medications Reconciled  Social History  Alcohol use  Moderate alcohol use, Occasional alcohol use. Caffeine use  Coffee. No drug use  Tobacco use  Never smoker.  Family History  Breast Cancer  Family Members In General. Cancer  Family Members In General, Mother.  Pregnancy / Birth History  Age at menarche  53 years. Contraceptive History  Oral contraceptives. Gravida  3 Irregular periods  Length (months) of breastfeeding  3-6 Maternal age  52-25 Para  1   Review of Systems  General Not Present- Appetite Loss, Chills, Fatigue, Fever, Night Sweats, Weight Gain and  Weight Loss. Skin Not Present- Change in Wart/Mole, Dryness, Hives, Jaundice, New Lesions, Non-Healing Wounds, Rash and Ulcer. HEENT Present- Seasonal Allergies and Wears glasses/contact lenses. Not Present- Earache, Hearing Loss, Hoarseness, Nose Bleed, Oral Ulcers, Ringing in the Ears, Sinus Pain, Sore Throat, Visual Disturbances and Yellow Eyes. Respiratory Not Present- Bloody sputum, Chronic Cough, Difficulty Breathing, Snoring and Wheezing. Breast Not Present- Breast Mass, Breast Pain, Nipple Discharge and Skin Changes. Cardiovascular Not Present- Chest Pain, Difficulty Breathing Lying Down, Leg Cramps, Palpitations, Rapid Heart Rate, Shortness of Breath and Swelling of Extremities. Gastrointestinal Not Present- Abdominal Pain, Bloating, Bloody Stool, Change in Bowel Habits, Chronic diarrhea, Constipation, Difficulty Swallowing, Excessive gas, Gets full quickly at meals, Hemorrhoids, Indigestion, Nausea, Rectal Pain and Vomiting. Female Genitourinary Not Present- Frequency, Nocturia, Painful Urination, Pelvic Pain and Urgency. Musculoskeletal Not Present- Back Pain, Joint Pain, Joint Stiffness, Muscle Pain, Muscle Weakness and Swelling of Extremities. Neurological Not Present- Decreased Memory, Fainting, Headaches, Numbness, Seizures, Tingling, Tremor, Trouble walking and Weakness. Psychiatric Not Present- Anxiety, Bipolar, Change in Sleep Pattern, Depression, Fearful and Frequent crying. Endocrine Present- Hot flashes. Not Present- Cold Intolerance, Excessive Hunger, Hair Changes, Heat Intolerance and New Diabetes. Hematology Not Present- Blood Thinners, Easy Bruising, Excessive bleeding, Gland problems, HIV and Persistent Infections.   Physical Exam  General Mental Status-Alert. Eye Sclera/Conjunctiva - Bilateral-No scleral icterus. Chest and Lung Exam Chest and lung exam reveals -quiet, even and easy respiratory effort with no use of accessory muscles and on auscultation, normal  breath sounds, no adventitious sounds and normal vocal resonance. Breast Nipples-No Discharge. Breast Lump-No Palpable Breast Mass.  Cardiovascular Cardiovascular examination reveals -normal heart sounds, regular rate and rhythm with no murmurs. Abdomen Note: soft no hm Neurologic Neurologic evaluation reveals -alert and oriented x 3 with no impairment of recent or remote memory. Lymphatic Head & Neck General Head & Neck Lymphatics: Bilateral - Description - Normal. Axillary General Axillary Region: Bilateral - Description - Normal. Note: no  adenopathy   Assessment & Plan  BREAST NEOPLASM, TIS (DCIS), RIGHT (D05.11) Story: Right nsm with sn biopsy, risk reducing left nsm We discussed the staging and pathophysiology of breast cancer. We discussed all of the different options for treatment for breast cancer including surgery, chemotherapy, radiation therapy, Herceptin, and antiestrogen therapy. We discussed a sentinel lymph node biopsy if we do mastectomy for dcis. We discussed the performance of that with injection of radioactive tracer.We discussed up to a 5% risk lifetime of chronic shoulder pain as well as lymphedema associated with a sentinel lymph node biopsy. We discussed the options for treatment of the breast cancer which included lumpectomy versus a mastectomy.I dont think she is candidate for lumpectomy. we discussed nsm. I will have her see plastic surgery. dont need to biopsy other area in breast. discussed possible pos nipple pathology requiring excision. she would like rrm on other side. I told her was not 100% preventive and not mandatory desires for symmetry. will have her see plastics and then discuss plan We discussed the risks of operation including bleeding, infection, possible reoperation. She understands her further therapy will be based on what her stages at the time of her operation.

## 2018-03-18 NOTE — Op Note (Signed)
Preoperative diagnosis: Right breast ductal carcinoma in situ Postoperative diagnosis: Same as above Procedure: 1.  Right nipple sparing mastectomy 2.  Right deep axillary sentinel lymph node biopsy 3.  Left risk reducing nipple sparing mastectomy Surgeon: Dr. Serita Grammes Assistant: Dr. Irene Limbo Anesthesia: General with bilateral pectoral blocks with exparel Estimated blood loss: 295 cc Complications: None Drains: Per plastic surgery Specimens: 1.  Left nipple sparing mastectomy marked short superior, long lateral, double nipple areolar margin 2.  Left nipple biopsy 3.  Right nipple sparing mastectomy marked short superior, long lateral, double nipple areolar margin 4.  Right nipple biopsy 5.  Right deep axillary sentinel lymph nodes with the highest count of 2537 Special count was correct at completion Disposition Case turned over to plastic surgery for reconstruction Indications: This is a 53 year old female with a screening MRI due to high risk that shows a number of different areas of ductal carcinoma in the right breast.  We discussed a mastectomy on this side just to be able to remove all of the disease.  We discussed a nipple sparing mastectomy along with a an axillary sentinel lymph node biopsy at the same time.  She desired to have a risk reducing left nipple sparing mastectomy on the left.  This will be combined with expander reconstruction.  Procedure: After informed consent was obtained the patient was taken to the operating room.  She was given antibiotics.  She had undergone bilateral pectoral blocks.  She was injected with technetium in the standard periareolar fashion.  She had SCDs in place.  She was placed under general anesthesia without complication.  She was prepped and draped in the standard sterile surgical fashion.  A surgical timeout was then performed.  I first did the risk reducing left-sided mastectomy.  I made an incision 8 cm from the xiphoid that  extended about 12 cm in the inframammary fold.  I then used cautery to create the posterior plane using the lighted retractor system and remove the pectoralis fascia from the pectoralis muscle.  I did this to the parasternal region, latissimus, and clavicle superiorly.  I then used the lighted retractors to remove the breast from the skin anteriorly.  I remove the breast tissue in its entirety.  I removed several other areas on the flaps and there was no remaining breast tissue that I could identify.  I then remove the entire undersurface of the left nipple and sent this as a separate specimen.  Hemostasis was obtained on this side.  I then packed this side and went to the other side.  I performed the right nipple sparing mastectomy in a similar fashion through an identical incision.  I removed any breast tissue that was present on the flaps.  I also remove the nipple specimen separately and sent this as a separate specimen.  The breast was marked as the other side was then passed off the table.  Hemostasis was then obtained.  The flaps were all viable.  I packed this.  I elected to make a separate incision in the right axilla for the sentinel node.  I then made an incision that I carried through the axillary fascia.  Identified what appeared to be a couple of normal-appearing nodes that were radioactive.  The counts are listed as above.  The was were excised.  Hemostasis was obtained.  There was minimal background radioactivity.  I then closed this with 2-0 Vicryl, 3-0 Vicryl, and 4-0 Monocryl.  She tolerated this portion well.  She  was then turned over to Dr. Iran Planas for completion of the operation.

## 2018-03-18 NOTE — Anesthesia Procedure Notes (Signed)
Anesthesia Regional Block: Pectoralis block   Pre-Anesthetic Checklist: ,, timeout performed, Correct Patient, Correct Site, Correct Laterality, Correct Procedure, Correct Position, site marked, Risks and benefits discussed,  Surgical consent,  Pre-op evaluation,  At surgeon's request and post-op pain management  Laterality: Left  Prep: chloraprep       Needles:  Injection technique: Single-shot  Needle Type: Echogenic Needle     Needle Length: 9cm  Needle Gauge: 21     Additional Needles:   Procedures:,,,, ultrasound used (permanent image in chart),,,,  Narrative:  Start time: 03/18/2018 9:33 AM End time: 03/18/2018 9:40 AM Injection made incrementally with aspirations every 5 mL.  Performed by: Personally  Anesthesiologist: Barnet Glasgow, MD  Additional Notes: Block assessed. Patient tolerated procedure well.

## 2018-03-18 NOTE — Progress Notes (Signed)
Emotional support during breast injections °

## 2018-03-18 NOTE — Anesthesia Postprocedure Evaluation (Signed)
Anesthesia Post Note  Patient: Nichole Arnold  Procedure(s) Performed: RIGHT NIPPLE SPARING MASTECTOMY WITH RIGHT AXILLARY SENTINEL LYMPH NODE BIOPSY AND LEFT RISK REDUCING NIPPLE SPARING MASTECTOMY (Bilateral Breast) BILATERAL BREAST RECONSTRUCTION WITH PLACEMENT OF TISSUE EXPANDER AND ALLODERM (Bilateral Breast)     Patient location during evaluation: PACU Anesthesia Type: General Level of consciousness: awake and alert Pain management: pain level controlled Vital Signs Assessment: post-procedure vital signs reviewed and stable Respiratory status: spontaneous breathing, nonlabored ventilation, respiratory function stable and patient connected to nasal cannula oxygen Cardiovascular status: blood pressure returned to baseline and stable Postop Assessment: no apparent nausea or vomiting Anesthetic complications: no    Last Vitals:  Vitals:   03/18/18 1530 03/18/18 1615  BP: (!) 95/55 (!) 95/56  Pulse: 82 76  Resp: 15 16  Temp:  (!) 36.1 C  SpO2: 98% 95%    Last Pain:  Vitals:   03/18/18 1515  TempSrc:   PainSc: Asleep   Pain Goal: Patients Stated Pain Goal: 0 (03/18/18 0857)                 Barnet Glasgow

## 2018-03-18 NOTE — Op Note (Signed)
Operative Note   DATE OF OPERATION: 3.10.20  LOCATION: [facility]-outpatient  SURGICAL DIVISION: Plastic Surgery  PREOPERATIVE DIAGNOSES:  1. DCIS right breast ER+   POSTOPERATIVE DIAGNOSES:  same  PROCEDURE:  1. Bilateral breast reconstruction with tissue expanders 2. Acellular dermis (Alloderm) for breast reconstruction 600cm2   SURGEON: Irene Limbo MD MBA  ASSISTANT: none  ANESTHESIA:  General.   EBL: 300 ml for entire procedure  COMPLICATIONS: None immediate.   INDICATIONS FOR PROCEDURE:  The patient, Nichole Arnold, is a 53 y.o. female born on 1965-12-06, is here for immediate prepectoral breast reconstruction following nipple sparing mastectomies.    FINDINGS: Natrelle 133S-FV-13-T 500 ml tissue expanders placed bilateral initial fill volume 420 ml air. RIGHT SN [id number] LEFT [id number]  DESCRIPTION OF PROCEDURE:  The patient was marked with the patient in the preoperative area to mark sternal notch, chest midline, anterior axillary lines and inframammary folds.Following induction,Foley catheter placed. The patient's operative site was prepped and draped in a sterile fashion. A time out was performed and all information was confirmed to be correct. Following completion of mastectomies, reconstruction began onleftside.  The cavity was irrigated with solution containing Ancef, gentamicin, and bacitracin. Hemostasis was ensured. A 19 Fr drain was placed in subcutaneous position laterally and a 15 Fr drain placed along inframammary fold. Each secured to skin with 2-0 nylon. Cavity irrigated with Betadine. The tissue expanders were prepared on back table prior in insertion. The expander was filled with air to463ml.Perforated acellular dermiswasdraped over anterior surface expander. The ADM was then secured to itself over posterior surface of expander with 4-0 chromic. Redundant folds acellular dermis excised so that the ADM lay flat without folds over air  filled expander.The expander was secured tofascia over lateral sternal borderwith a 0 vicryl. Thelateral tab wasalso secured to pectoralis muscle with 0-vicryl. The ADM was secured to pectoralis muscle and chest wall along inferior border at inframammary foldwith 0 V-lock suture.Laterally the mastectomy flap over posterior axillary line was advanced anteriorly and the subcutaneous tissue and superficial fascia was secured to pectoralis muscle and acellular dermis with 0-vicryl. Skin closure completedwith 3-0 vicryl in fascial layer and 4-0 vicryl in dermis. Skin closure completed with 4-0 monocryl subcuticular and tissue adhesive.  I then directed my attention torightchest where similar irrigation and drain placement completed. The prepared expander with ADM secured over anterior surface was placed in right chest and tabs secured to chest wall and pectoralis muscle with 0- vicryl suture. The acellular dermis at inframammary fold was secured to chest wall with 0 V-lock suture.Laterally the mastectomy flap over posterior axillary line was advanced anteriorly and the subcutaneous tissue and superficial fascia was secured to pectoralis muscle and acellular dermis with 0-vicryl. Skin closure completedwith 3-0 vicryl in fascial layer and 4-0 vicryl in dermis. Skin closure completed with 4-0 monocryl subcuticular and tissue adhesive.The mastectomy flaps were redraped so that NAC was symmetric from midline and sternal notch. Tegaderm dressings applied followed bydry dressing,breast binder.  The patient was allowed to wake from anesthesia, extubated and taken to the recovery room in satisfactory condition.   SPECIMENS: none  DRAINS: 15 and 19 Fr JP in right and left breast reconstruction  Irene Limbo, MD [facility] Plastic & Reconstructive Surgery [phone], pin [phone]

## 2018-03-19 DIAGNOSIS — Z9012 Acquired absence of left breast and nipple: Secondary | ICD-10-CM | POA: Diagnosis not present

## 2018-03-19 DIAGNOSIS — D0511 Intraductal carcinoma in situ of right breast: Secondary | ICD-10-CM | POA: Diagnosis not present

## 2018-03-19 DIAGNOSIS — C50411 Malignant neoplasm of upper-outer quadrant of right female breast: Secondary | ICD-10-CM | POA: Diagnosis not present

## 2018-03-19 NOTE — Discharge Summary (Signed)
Physician Discharge Summary  Patient ID: Nichole Arnold MRN: [id number] DOB/AGE: Jul 15, 1965 53 y.o.  Admit date: 03/18/2018 Discharge date: 03/19/2018  Admission Diagnoses: Right dcis Discharge Diagnoses:  Active Problems:   Breast neoplasm, Tis (DCIS), right   Discharged Condition: good  Hospital Course: 59 yof s/p bilateral nsm with right ax sn biopsy for right breast dcis. Also with prepec expander reconstruction. diong well, drains as expected, will dc home  Consults: None  Significant Diagnostic Studies: none  Treatments: surgery: bilateral nsm, right ax sn biopsy  Discharge Exam: Blood pressure 102/67, pulse 83, temperature 98.8 F (37.1 C), resp. rate 20, height 5\' 10"  (1.778 m), weight 104.9 kg, last menstrual period 03/01/2018, SpO2 98 %. flaps viable, nac viable, ax incision clean, drains as expected  Disposition: Discharge disposition: 01-Home or Self Care       Discharge Instructions    Call MD for:  redness, tenderness, or signs of infection (pain, swelling, bleeding, redness, odor or green/yellow discharge around incision site)   Complete by:  As directed    Call MD for:  temperature >100.5   Complete by:  As directed    Discharge instructions   Complete by:  As directed    Ok to remove dressings and shower am 3.12.20. Soap and water ok, pat Tegaderms dry. Do not remove Tegaderms. No creams or ointments over incisions. Do not let drains dangle in shower, attach to lanyard or similar.Strip and record drains twice daily and bring log to clinic visit.  Breast binder or soft compression bra all other times.  Ok to raise arms above shoulders for bathing and dressing.  No house yard work or exercise until cleared by MD.   Recommend Miralax or Dulcolax as needed for constipation. Recommend ibuprofen with meals to help with pain control. Ok to also use Tylenol for pain.   Driving Restrictions   Complete by:  As directed    No driving for 2 weeks then no  driving if taking narcotics.   Lifting restrictions   Complete by:  As directed    No lifting > 5 lbs until cleared by MD   Resume previous diet   Complete by:  As directed       Follow-up Information    Rolm Bookbinder, MD In 2 weeks.   Specialty:  General Surgery Contact information: [address] [place] [address] [phone]        Irene Limbo, MD In 1 week.   Specialty:  Plastic Surgery Why:  as scheduled Contact information: [place] [address] [phone]           Signed: Rolm Bookbinder 03/19/2018, 7:20 AM

## 2018-03-19 NOTE — Discharge Instructions (Signed)

## 2018-03-19 NOTE — Discharge Summary (Signed)
Physician Discharge Summary  Patient ID: Nichole Arnold MRN: [id number] DOB/AGE: [birth year] 52 y.o.  Admit date: 03/18/2018 Discharge date: 03/19/2018  Admission Diagnoses: Right breast DCIS  Discharge Diagnoses:  Active Problems:   Breast neoplasm, Tis (DCIS), right  Discharged Condition: stable  Hospital Course: Patient did will post operatively with minimal oral pain medication use, tolerating diet and ambulatory with minimal assist.  Treatments: surgery: bilateral nipple sparing mastectomies right sentinel node bilateral breast reconstruction with tissue expanders acellular dermis 3.10.20  Discharge Exam: Blood pressure 102/67, pulse 83, temperature 98.8 F (37.1 C), resp. rate 20, height 5\' 10"  (1.778 m), weight 104.9 kg, last menstrual period 03/01/2018, SpO2 98 %. Incision/Wound: chest soft Tegaderms in place drains serosanguinous incisions intact  Disposition: home   Discharge Instructions    Call MD for:  redness, tenderness, or signs of infection (pain, swelling, bleeding, redness, odor or green/yellow discharge around incision site)   Complete by:  As directed    Call MD for:  temperature >100.5   Complete by:  As directed    Discharge instructions   Complete by:  As directed    Ok to remove dressings and shower am 3.12.20. Soap and water ok, pat Tegaderms dry. Do not remove Tegaderms. No creams or ointments over incisions. Do not let drains dangle in shower, attach to lanyard or similar.Strip and record drains twice daily and bring log to clinic visit.  Breast binder or soft compression bra all other times.  Ok to raise arms above shoulders for bathing and dressing.  No house yard work or exercise until cleared by MD.   Recommend Miralax or Dulcolax as needed for constipation. Recommend ibuprofen with meals to help with pain control. Ok to also use Tylenol for pain.   Driving Restrictions   Complete by:  As directed    No driving for 2 weeks then no driving  if taking narcotics.   Lifting restrictions   Complete by:  As directed    No lifting > 5 lbs until cleared by MD   Resume previous diet   Complete by:  As directed       Follow-up Information    Rolm Bookbinder, MD In 2 weeks.   Specialty:  General Surgery Contact information: [address] [place] [address] [phone]        Irene Limbo, MD In 1 week.   Specialty:  Plastic Surgery Why:  as scheduled Contact information: [address] [place]  [address] [phone]           Signed: Irene Limbo 03/19/2018, 7:20 AM

## 2018-03-20 ENCOUNTER — Encounter (HOSPITAL_BASED_OUTPATIENT_CLINIC_OR_DEPARTMENT_OTHER): Payer: Self-pay | Admitting: General Surgery

## 2018-03-31 ENCOUNTER — Ambulatory Visit (INDEPENDENT_AMBULATORY_CARE_PROVIDER_SITE_OTHER): Payer: 59 | Admitting: Family Medicine

## 2018-03-31 ENCOUNTER — Other Ambulatory Visit: Payer: Self-pay

## 2018-03-31 DIAGNOSIS — J209 Acute bronchitis, unspecified: Secondary | ICD-10-CM | POA: Diagnosis not present

## 2018-03-31 MED ORDER — HYDROCOD POLST-CPM POLST ER 10-8 MG/5ML PO SUER: 5.0000 mL | Freq: Two times a day (BID) | ORAL | 0 refills | Status: DC | PRN

## 2018-03-31 NOTE — Assessment & Plan Note (Signed)
Improved but now with a post viral cough syndrome (worse with pnd from allergies as well)  Recently had mastectomy and still has drains Cough is uncomfortable  No wheeze or fever/ also using the IS w/o problems  Sent in refill of tussionex Disc symptomatic care with rest and isolation also  Update if not starting to improve in a week or if worsening  -esp if fever or worse cough or wheezing

## 2018-03-31 NOTE — Progress Notes (Signed)
Virtual Visit via Telephone Note  I connected with Nichole Arnold on 03/31/18 at  3:45 PM EDT by telephone and verified that I am speaking with the correct person using two identifiers.   I discussed the limitations, risks, security and privacy concerns of performing an evaluation and management service by telephone and the availability of in person appointments. I also discussed with the patient that there may be a patient responsible charge related to this service. The patient expressed understanding and agreed to proceed.   History of Present Illness: Has uri symptoms Had bronchitis last month (tx with prednisone taper and tussionex)  She got better for a while   Had double mastectomy on 3/10 Did well  Has drains in   Allergies started- started back on zyrtec   Coughing is worse  Not sleeping  No wheezing right now  Using IS from hospital - no problems using it  Coughing up just scant phlegm (mostly dry)   No fever or chills   Nasal congestion in am - clears up  Throat and ears are ok    She is eating and drinking- soup and tea    otc nyquil  burbon - shot at night  Zyrtec    Observations/Objective: Pt sounds hoarse on the phone Cheerful and talkative    Assessment and Plan: Problem List Items Addressed This Visit      Respiratory   Acute bronchitis - Primary    Improved but now with a post viral cough syndrome (worse with pnd from allergies as well)  Recently had mastectomy and still has drains Cough is uncomfortable  No wheeze or fever/ also using the IS w/o problems  Sent in refill of tussionex Disc symptomatic care with rest and isolation also  Update if not starting to improve in a week or if worsening  -esp if fever or worse cough or wheezing           Follow Up Instructions:    I discussed the assessment and treatment plan with the patient. The patient was provided an opportunity to ask questions and all were answered. The patient agreed  with the plan and demonstrated an understanding of the instructions.   The patient was advised to call back or seek an in-person evaluation if the symptoms worsen or if the condition fails to improve as anticipated.  I provided 16 minutes of non-face-to-face time during this encounter.   Loura Pardon, MD

## 2018-04-01 ENCOUNTER — Other Ambulatory Visit: Payer: Self-pay | Admitting: *Deleted

## 2018-04-01 MED ORDER — ADVAIR DISKUS 100-50 MCG/DOSE IN AEPB: 1.0000 | INHALATION_SPRAY | Freq: Two times a day (BID) | RESPIRATORY_TRACT | 0 refills | Status: DC

## 2018-06-05 NOTE — H&P (Signed)
Subjective:     Patient ID: Nichole Arnold is a 53 y.o. female.  HPI  2.5 months post op. Scheduled for implant exchange next month. Notes she can feel folds expander over lower poles, counseled this is normal. Returns stating she feels she is a few "months behind" in dealing with the emotions of diagnosis, surgery. Feels she looks worse than when she started. Also ended relationship with boyfriend since last visit.  History right breast excisional biopsy 2016, with ADH and ALH. Declined tamoxifen at that time. Presented following screening MRI showing right breast three focal areas NME spanning 6.0 x 3.6 cm in total. Additionally within the right breast in the retroareolar location there is a 7 mm enhancing mass. Three MR guided biopsies performed labeled outer central, UOQ, upper central all demonstrated intermediate grade DCIS, ER/PR+. Final pathology right DCIS intermmediate grade 3 cm, margins clear, 0/1 SLN  Prior 38 D. Desires smaller, states was B cup prior to pregnancy. Right mastectomy 544 g Left 582 g  Lives with father, who is independent.   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  Pulmonary/Chest: Effort normal and breath sounds normal.  Abdominal: Soft.   Chest: chest soft expanded no visible rippling NAC located over lower pole expanders Nipple to IMF R 7 L 7 cm    Assessment:     DCIS R breast ER+ S/p bilateral NSM, prepectoral TE/ADM (Alloderm) reconstruction    Plan:     Pictures today. Plan removal bilateral TE and placement implants, possible lipofilling.  Reviewed saline vs silicone, shaped vs round. HCG or capacity filled silicone implants may offer reduced risk visible rippling. Reviewed MRI or USsurveillance for rupture with silicone implants. Reviewed no implant is permanent device and may require additional  surgery.Reviewed examples for 4th generation, capacity filled 4th generation, and HCG implants vs saline implants. Reviewed risks AP flipping that may be more noticeable with 5th generation implants, may require surgery to correct. Discussed texturing purpose and risk ALCL with this. Her friend that is in medicine send her article about Natrelle [employer] recall implants- again reviewed this is related to texturing, the implants we reviewed are all smooth round, that Natrelle brand implants is my preferred implants for this surgery. We have agreed to smooth round capacity filled.  Reviewed purpose fat grafting to thicken flaps to minimize visible rippling, contour depression. Discussed variable take graft, fat necrosis that presents as lump, donor site pain and compression. Does not have rippling presently, plan to defer this for now.  Discussed risk COVID infectionthrough this elective surgery. Patient will receive COVID testing prior to surgery. Discussed even if patient receivesa negative test result, the tests in some cases may fail to detect the virus or patient maycontract COVID after the test.COVID 19 infectionbefore/during/aftersurgery may result in lead to a higher chance of complication and death.  Many of questions today regarding lifting of nipples. She brings in picture of NSM on internet asking if this is realistic outcome- the picture shown is of a smaller chested woman with no ptosis. We have discussed this prior to mastectomies that with NSM the NAC position is set and it is not always possible to move this or perform "lift" in future. Counseled the picture she brought is not a realistic outcome for her as she had ptosis prior to mastectomies. She will get some mild change in position as implants  will have greater projection than her expander. She also reiterated desire for smaller breast size, goal to lose 60 lbs. Reviewed the chest width is my primary guiding factor for selecting  implant size. With wt loss, I do not anticipate NAC position to necessarily change in post mastectomy setting, but the appropriateness of volume selected may change with significant wt loss, may need revision in future.  Rx for Bactrim and Robaxin given. Did not like oxy, will use Aleve.  Natrelle 133S-FV-13-T 500 ml tissue expanders placed bilateral  fill volume 500 ml saline   Irene Limbo, MD [facility] Plastic & Reconstructive Surgery [phone], pin [phone]

## 2018-06-20 ENCOUNTER — Other Ambulatory Visit: Payer: Self-pay

## 2018-06-20 ENCOUNTER — Encounter (HOSPITAL_BASED_OUTPATIENT_CLINIC_OR_DEPARTMENT_OTHER): Payer: Self-pay | Admitting: *Deleted

## 2018-06-22 ENCOUNTER — Other Ambulatory Visit: Payer: Self-pay | Admitting: Family Medicine

## 2018-06-24 ENCOUNTER — Other Ambulatory Visit (HOSPITAL_COMMUNITY)
Admission: RE | Admit: 2018-06-24 | Discharge: 2018-06-24 | Disposition: A | Discharge status: Home / Self Care | Payer: 59 | Source: Ambulatory Visit | Attending: Plastic Surgery | Admitting: Plastic Surgery

## 2018-06-24 DIAGNOSIS — Z1159 Encounter for screening for other viral diseases: Secondary | ICD-10-CM | POA: Diagnosis not present

## 2018-06-24 NOTE — Progress Notes (Signed)
Pt given CHG scrub with instructions. Pt states she used it before and has no questions.  Pt also given ENSURE drink with instructions and verbalized understanding.

## 2018-06-25 LAB — NOVEL CORONAVIRUS, NAA (HOSP ORDER, SEND-OUT TO REF LAB; TAT 18-24 HRS): SARS-CoV-2, NAA: NOT DETECTED

## 2018-06-27 ENCOUNTER — Ambulatory Visit (HOSPITAL_BASED_OUTPATIENT_CLINIC_OR_DEPARTMENT_OTHER): Payer: 59 | Admitting: Anesthesiology

## 2018-06-27 ENCOUNTER — Ambulatory Visit (HOSPITAL_BASED_OUTPATIENT_CLINIC_OR_DEPARTMENT_OTHER)
Admission: RE | Admit: 2018-06-27 | Discharge: 2018-06-27 | Disposition: A | Discharge status: Home / Self Care | Payer: 59 | Source: Ambulatory Visit | Attending: Plastic Surgery | Admitting: Plastic Surgery

## 2018-06-27 ENCOUNTER — Other Ambulatory Visit: Payer: Self-pay

## 2018-06-27 ENCOUNTER — Encounter (HOSPITAL_BASED_OUTPATIENT_CLINIC_OR_DEPARTMENT_OTHER): Payer: Self-pay | Admitting: *Deleted

## 2018-06-27 ENCOUNTER — Encounter (HOSPITAL_BASED_OUTPATIENT_CLINIC_OR_DEPARTMENT_OTHER)
Admission: RE | Disposition: A | Discharge status: Home / Self Care | Payer: Self-pay | Source: Ambulatory Visit | Attending: Plastic Surgery

## 2018-06-27 DIAGNOSIS — J45909 Unspecified asthma, uncomplicated: Secondary | ICD-10-CM | POA: Diagnosis not present

## 2018-06-27 DIAGNOSIS — Z421 Encounter for breast reconstruction following mastectomy: Secondary | ICD-10-CM | POA: Insufficient documentation

## 2018-06-27 DIAGNOSIS — F419 Anxiety disorder, unspecified: Secondary | ICD-10-CM | POA: Insufficient documentation

## 2018-06-27 DIAGNOSIS — Z853 Personal history of malignant neoplasm of breast: Secondary | ICD-10-CM | POA: Insufficient documentation

## 2018-06-27 DIAGNOSIS — Z170 Estrogen receptor positive status [ER+]: Secondary | ICD-10-CM | POA: Insufficient documentation

## 2018-06-27 HISTORY — PX: REMOVAL OF BILATERAL TISSUE EXPANDERS WITH PLACEMENT OF BILATERAL BREAST IMPLANTS: SHX6431

## 2018-06-27 SURGERY — REMOVAL, TISSUE EXPANDER, BREAST, BILATERAL, WITH BILATERAL IMPLANT IMPLANT INSERTION
Anesthesia: General | Site: Breast | Laterality: Bilateral

## 2018-06-27 MED ORDER — OXYCODONE HCL 5 MG PO TABS
ORAL_TABLET | ORAL | Status: AC
  Filled 2018-06-27: qty 1

## 2018-06-27 MED ORDER — FENTANYL CITRATE (PF) 100 MCG/2ML IJ SOLN
50.0000 ug | INTRAMUSCULAR | Status: DC | PRN
  Administered 2018-06-27 (×2): 50 ug via INTRAVENOUS

## 2018-06-27 MED ORDER — EPHEDRINE SULFATE 50 MG/ML IJ SOLN
INTRAMUSCULAR | Status: DC | PRN
  Administered 2018-06-27: 10 mg via INTRAVENOUS

## 2018-06-27 MED ORDER — CHLORHEXIDINE GLUCONATE CLOTH 2 % EX PADS: 6.0000 | MEDICATED_PAD | Freq: Once | CUTANEOUS | Status: DC

## 2018-06-27 MED ORDER — ONDANSETRON HCL 4 MG/2ML IJ SOLN: 4.0000 mg | Freq: Four times a day (QID) | INTRAMUSCULAR | Status: DC | PRN

## 2018-06-27 MED ORDER — SODIUM CHLORIDE 0.9 % IV SOLN
INTRAVENOUS | Status: DC | PRN
  Administered 2018-06-27: 1000 mL

## 2018-06-27 MED ORDER — LIDOCAINE 2% (20 MG/ML) 5 ML SYRINGE
INTRAMUSCULAR | Status: AC
  Filled 2018-06-27: qty 5

## 2018-06-27 MED ORDER — PROPOFOL 10 MG/ML IV BOLUS
INTRAVENOUS | Status: AC
  Filled 2018-06-27: qty 20

## 2018-06-27 MED ORDER — LACTATED RINGERS IV SOLN
INTRAVENOUS | Status: DC
  Administered 2018-06-27 (×2): via INTRAVENOUS

## 2018-06-27 MED ORDER — ACETAMINOPHEN 500 MG PO TABS
ORAL_TABLET | ORAL | Status: AC
  Filled 2018-06-27: qty 2

## 2018-06-27 MED ORDER — PROPOFOL 10 MG/ML IV BOLUS
INTRAVENOUS | Status: DC | PRN
  Administered 2018-06-27: 200 mg via INTRAVENOUS

## 2018-06-27 MED ORDER — ONDANSETRON HCL 4 MG/2ML IJ SOLN
INTRAMUSCULAR | Status: AC
  Filled 2018-06-27: qty 2

## 2018-06-27 MED ORDER — PROPOFOL 500 MG/50ML IV EMUL
INTRAVENOUS | Status: DC | PRN
  Administered 2018-06-27: 15 ug/kg/min via INTRAVENOUS

## 2018-06-27 MED ORDER — SCOPOLAMINE 1 MG/3DAYS TD PT72: 1.0000 | MEDICATED_PATCH | Freq: Once | TRANSDERMAL | Status: DC

## 2018-06-27 MED ORDER — SUCCINYLCHOLINE CHLORIDE 20 MG/ML IJ SOLN
INTRAMUSCULAR | Status: DC | PRN
  Administered 2018-06-27: 100 mg via INTRAVENOUS

## 2018-06-27 MED ORDER — GABAPENTIN 300 MG PO CAPS
300.0000 mg | ORAL_CAPSULE | ORAL | Status: AC
  Administered 2018-06-27: 09:00:00 300 mg via ORAL

## 2018-06-27 MED ORDER — SUCCINYLCHOLINE CHLORIDE 200 MG/10ML IV SOSY
PREFILLED_SYRINGE | INTRAVENOUS | Status: AC
  Filled 2018-06-27: qty 10

## 2018-06-27 MED ORDER — OXYCODONE HCL 5 MG/5ML PO SOLN: 5.0000 mg | Freq: Once | ORAL | Status: AC | PRN

## 2018-06-27 MED ORDER — LIDOCAINE 2% (20 MG/ML) 5 ML SYRINGE
INTRAMUSCULAR | Status: DC | PRN
  Administered 2018-06-27: 60 mg via INTRAVENOUS

## 2018-06-27 MED ORDER — FENTANYL CITRATE (PF) 100 MCG/2ML IJ SOLN
INTRAMUSCULAR | Status: AC
  Filled 2018-06-27: qty 2

## 2018-06-27 MED ORDER — OXYCODONE HCL 5 MG PO TABS
5.0000 mg | ORAL_TABLET | Freq: Once | ORAL | Status: AC | PRN
  Administered 2018-06-27: 5 mg via ORAL

## 2018-06-27 MED ORDER — GABAPENTIN 300 MG PO CAPS
ORAL_CAPSULE | ORAL | Status: AC
  Filled 2018-06-27: qty 1

## 2018-06-27 MED ORDER — DEXAMETHASONE SODIUM PHOSPHATE 4 MG/ML IJ SOLN
INTRAMUSCULAR | Status: DC | PRN
  Administered 2018-06-27: 10 mg via INTRAVENOUS

## 2018-06-27 MED ORDER — CELECOXIB 200 MG PO CAPS
ORAL_CAPSULE | ORAL | Status: AC
  Filled 2018-06-27: qty 1

## 2018-06-27 MED ORDER — CEFAZOLIN SODIUM-DEXTROSE 2-4 GM/100ML-% IV SOLN
INTRAVENOUS | Status: AC
  Filled 2018-06-27: qty 100

## 2018-06-27 MED ORDER — MIDAZOLAM HCL 2 MG/2ML IJ SOLN
INTRAMUSCULAR | Status: AC
  Filled 2018-06-27: qty 2

## 2018-06-27 MED ORDER — ACETAMINOPHEN 500 MG PO TABS
1000.0000 mg | ORAL_TABLET | ORAL | Status: AC
  Administered 2018-06-27: 1000 mg via ORAL

## 2018-06-27 MED ORDER — MIDAZOLAM HCL 2 MG/2ML IJ SOLN
1.0000 mg | INTRAMUSCULAR | Status: DC | PRN
  Administered 2018-06-27: 2 mg via INTRAVENOUS

## 2018-06-27 MED ORDER — DEXAMETHASONE SODIUM PHOSPHATE 10 MG/ML IJ SOLN
INTRAMUSCULAR | Status: AC
  Filled 2018-06-27: qty 1

## 2018-06-27 MED ORDER — CEFAZOLIN SODIUM-DEXTROSE 2-4 GM/100ML-% IV SOLN
2.0000 g | INTRAVENOUS | Status: AC
  Administered 2018-06-27: 2 g via INTRAVENOUS

## 2018-06-27 MED ORDER — CELECOXIB 200 MG PO CAPS
200.0000 mg | ORAL_CAPSULE | ORAL | Status: AC
  Administered 2018-06-27: 200 mg via ORAL

## 2018-06-27 MED ORDER — FENTANYL CITRATE (PF) 100 MCG/2ML IJ SOLN
25.0000 ug | INTRAMUSCULAR | Status: DC | PRN
  Administered 2018-06-27 (×2): 50 ug via INTRAVENOUS

## 2018-06-27 MED ORDER — ONDANSETRON HCL 4 MG/2ML IJ SOLN
INTRAMUSCULAR | Status: DC | PRN
  Administered 2018-06-27: 4 mg via INTRAVENOUS

## 2018-06-27 SURGICAL SUPPLY — 88 items
ADH SKN CLS APL DERMABOND .7 (GAUZE/BANDAGES/DRESSINGS) ×4
APL PRP STRL LF DISP 70% ISPRP (MISCELLANEOUS) ×4
BAG DECANTER FOR FLEXI CONT (MISCELLANEOUS) ×3 IMPLANT
BINDER ABDOMINAL 10 UNV 27-48 (MISCELLANEOUS) IMPLANT
BINDER ABDOMINAL 12 SM 30-45 (SOFTGOODS) IMPLANT
BINDER BREAST 3XL (GAUZE/BANDAGES/DRESSINGS) IMPLANT
BINDER BREAST LRG (GAUZE/BANDAGES/DRESSINGS) IMPLANT
BINDER BREAST MEDIUM (GAUZE/BANDAGES/DRESSINGS) IMPLANT
BINDER BREAST XLRG (GAUZE/BANDAGES/DRESSINGS) IMPLANT
BINDER BREAST XXLRG (GAUZE/BANDAGES/DRESSINGS) ×2 IMPLANT
BLADE SURG 10 STRL SS (BLADE) ×6 IMPLANT
BNDG GAUZE ELAST 4 BULKY (GAUZE/BANDAGES/DRESSINGS) ×6 IMPLANT
CANISTER LIPO FAT HARVEST (MISCELLANEOUS) IMPLANT
CANISTER SUCT 1200ML W/VALVE (MISCELLANEOUS) ×5 IMPLANT
CHLORAPREP W/TINT 26 (MISCELLANEOUS) ×5 IMPLANT
COVER BACK TABLE REUSABLE LG (DRAPES) ×3 IMPLANT
COVER MAYO STAND REUSABLE (DRAPES) ×4 IMPLANT
COVER WAND RF STERILE (DRAPES) IMPLANT
DECANTER SPIKE VIAL GLASS SM (MISCELLANEOUS) IMPLANT
DERMABOND ADVANCED (GAUZE/BANDAGES/DRESSINGS) ×2
DERMABOND ADVANCED .7 DNX12 (GAUZE/BANDAGES/DRESSINGS) ×4 IMPLANT
DRAIN CHANNEL 15F RND FF W/TCR (WOUND CARE) IMPLANT
DRAPE TOP ARMCOVERS (MISCELLANEOUS) ×3 IMPLANT
DRAPE U-SHAPE 76X120 STRL (DRAPES) ×3 IMPLANT
DRAPE UTILITY XL STRL (DRAPES) ×3 IMPLANT
DRSG PAD ABDOMINAL 8X10 ST (GAUZE/BANDAGES/DRESSINGS) ×6 IMPLANT
ELECT BLADE 4.0 EZ CLEAN MEGAD (MISCELLANEOUS) ×3
ELECT COATED BLADE 2.86 ST (ELECTRODE) ×3 IMPLANT
ELECT REM PT RETURN 9FT ADLT (ELECTROSURGICAL) ×3
ELECTRODE BLDE 4.0 EZ CLN MEGD (MISCELLANEOUS) ×2 IMPLANT
ELECTRODE REM PT RTRN 9FT ADLT (ELECTROSURGICAL) ×2 IMPLANT
EVACUATOR SILICONE 100CC (DRAIN) IMPLANT
EXTRACTOR CANIST REVOLVE STRL (CANNISTER) IMPLANT
GLOVE BIO SURGEON STRL SZ 6 (GLOVE) ×8 IMPLANT
GLOVE BIOGEL PI IND STRL 7.0 (GLOVE) ×1 IMPLANT
GLOVE BIOGEL PI INDICATOR 7.0 (GLOVE) ×1
GOWN STRL REUS W/ TWL LRG LVL3 (GOWN DISPOSABLE) ×4 IMPLANT
GOWN STRL REUS W/TWL LRG LVL3 (GOWN DISPOSABLE) ×6
IMPL BREAST P6.4XRND XFULL 615 (Breast) ×2 IMPLANT
IMPL BRST P6.4XRND XFULL 615CC (Breast) ×4 IMPLANT
IMPLANT BREAST GEL 615CC (Breast) ×6 IMPLANT
IV NS 500ML (IV SOLUTION)
IV NS 500ML BAXH (IV SOLUTION) IMPLANT
KIT FILL SYSTEM UNIVERSAL (SET/KITS/TRAYS/PACK) IMPLANT
LINER CANISTER 1000CC FLEX (MISCELLANEOUS) ×1 IMPLANT
MARKER SKIN DUAL TIP RULER LAB (MISCELLANEOUS) IMPLANT
NDL FILTER BLUNT 18X1 1/2 (NEEDLE) IMPLANT
NDL HYPO 25X1 1.5 SAFETY (NEEDLE) IMPLANT
NDL SAFETY ECLIPSE 18X1.5 (NEEDLE) ×2 IMPLANT
NEEDLE FILTER BLUNT 18X 1/2SAF (NEEDLE) ×1
NEEDLE FILTER BLUNT 18X1 1/2 (NEEDLE) ×2 IMPLANT
NEEDLE HYPO 18GX1.5 SHARP (NEEDLE) ×3
NEEDLE HYPO 25X1 1.5 SAFETY (NEEDLE) IMPLANT
NS IRRIG 1000ML POUR BTL (IV SOLUTION) ×2 IMPLANT
PACK BASIN DAY SURGERY FS (CUSTOM PROCEDURE TRAY) ×3 IMPLANT
PAD ALCOHOL SWAB (MISCELLANEOUS) ×1 IMPLANT
PENCIL BUTTON HOLSTER BLD 10FT (ELECTRODE) ×3 IMPLANT
PIN SAFETY STERILE (MISCELLANEOUS) IMPLANT
PUNCH BIOPSY DERMAL 4MM (MISCELLANEOUS) IMPLANT
SHEET MEDIUM DRAPE 40X70 STRL (DRAPES) ×6 IMPLANT
SIZER BREAST REUSE GEL 580CC (SIZER) ×3
SIZER BREAST REUSE XFP 615CC (SIZER) ×3
SIZER BRST REUSE GEL 580CC (SIZER) ×1 IMPLANT
SIZER BRST REUSE XFP 615CC (SIZER) ×1 IMPLANT
SLEEVE SCD COMPRESS KNEE MED (MISCELLANEOUS) ×3 IMPLANT
SPONGE LAP 18X18 RF (DISPOSABLE) ×6 IMPLANT
STAPLER VISISTAT 35W (STAPLE) ×3 IMPLANT
SUT ETHILON 2 0 FS 18 (SUTURE) IMPLANT
SUT MNCRL AB 4-0 PS2 18 (SUTURE) ×6 IMPLANT
SUT PDS AB 2-0 CT2 27 (SUTURE) IMPLANT
SUT VIC AB 3-0 PS1 18 (SUTURE)
SUT VIC AB 3-0 PS1 18XBRD (SUTURE) IMPLANT
SUT VIC AB 3-0 SH 27 (SUTURE) ×6
SUT VIC AB 3-0 SH 27X BRD (SUTURE) ×4 IMPLANT
SUT VICRYL 4-0 PS2 18IN ABS (SUTURE) ×6 IMPLANT
SYR 10ML LL (SYRINGE) ×5 IMPLANT
SYR 20CC LL (SYRINGE) IMPLANT
SYR 50ML LL SCALE MARK (SYRINGE) ×1 IMPLANT
SYR BULB IRRIGATION 50ML (SYRINGE) ×6 IMPLANT
SYR CONTROL 10ML LL (SYRINGE) IMPLANT
SYR TB 1ML LL NO SAFETY (SYRINGE) ×1 IMPLANT
SYRINGE TOOMEY DISP (SYRINGE) IMPLANT
TOWEL GREEN STERILE FF (TOWEL DISPOSABLE) ×6 IMPLANT
TUBE CONNECTING 20X1/4 (TUBING) ×5 IMPLANT
TUBING INFILTRATION IT-10001 (TUBING) ×1 IMPLANT
TUBING SET GRADUATE ASPIR 12FT (MISCELLANEOUS) ×1 IMPLANT
UNDERPAD 30X30 (UNDERPADS AND DIAPERS) ×6 IMPLANT
YANKAUER SUCT BULB TIP NO VENT (SUCTIONS) ×3 IMPLANT

## 2018-06-27 NOTE — Interval H&P Note (Signed)
History and Physical Interval Note:  06/27/2018 9:00 AM  Nichole Arnold  has presented today for surgery, with the diagnosis of acquired absence breasts, history DCIS.  The various methods of treatment have been discussed with the patient and family. After consideration of risks, benefits and other options for treatment, the patient has consented to  Removal bilateral chest tissue expanders and placement silicone implants as a surgical intervention.  The patient's history has been reviewed, patient examined, no change in status, stable for surgery.  I have reviewed the patient's chart and labs.  Questions were answered to the patient's satisfaction.     Nichole Arnold Nichole Arnold

## 2018-06-27 NOTE — Anesthesia Preprocedure Evaluation (Signed)
Anesthesia Evaluation  Patient identified by MRN, date of birth, ID band Patient awake    Reviewed: Allergy & Precautions, H&P , NPO status , Patient's Chart, lab work & pertinent test results  Airway Mallampati: II   Neck ROM: full    Dental   Pulmonary asthma ,    breath sounds clear to auscultation       Cardiovascular negative cardio ROS   Rhythm:regular Rate:Normal     Neuro/Psych PSYCHIATRIC DISORDERS Anxiety    GI/Hepatic   Endo/Other    Renal/GU      Musculoskeletal   Abdominal   Peds  Hematology   Anesthesia Other Findings   Reproductive/Obstetrics H/o breast CA                             Anesthesia Physical Anesthesia Plan  ASA: II  Anesthesia Plan: General   Post-op Pain Management:    Induction: Intravenous  PONV Risk Score and Plan: 3 and Ondansetron, Dexamethasone, Midazolam and Treatment may vary due to age or medical condition  Airway Management Planned: Oral ETT  Additional Equipment:   Intra-op Plan:   Post-operative Plan: Extubation in OR  Informed Consent: I have reviewed the patients History and Physical, chart, labs and discussed the procedure including the risks, benefits and alternatives for the proposed anesthesia with the patient or authorized representative who has indicated his/her understanding and acceptance.       Plan Discussed with: CRNA, Anesthesiologist and Surgeon  Anesthesia Plan Comments:         Anesthesia Quick Evaluation

## 2018-06-27 NOTE — Op Note (Signed)
Operative Note   DATE OF OPERATION: 6.19.20  LOCATION: [facility]-outpatient  SURGICAL DIVISION: Plastic Surgery  PREOPERATIVE DIAGNOSES:  1. History DCIS 2. Acquired absence bilateral breasts  POSTOPERATIVE DIAGNOSES:  same  PROCEDURE:  Removal bilateral chest tissue expanders and placement silicone implants  SURGEON: Irene Limbo MD MBA  ASSISTANT: none  ANESTHESIA:  General.   EBL: 50 ml  COMPLICATIONS: None immediate.   INDICATIONS FOR PROCEDURE:  The patient, Nichole Arnold, is a 53 y.o. female born on 1965-02-17, is here for staged breast reconstruction following nipple sparing mastectomies with immediate expander acellular dermis reconstruction.   FINDINGS: Full incorporation ADM noted bilateral. Natrelle Inspira Smooth Round Extra Projection 615 ml implants placed bilateral, REF [id number] RIGHT SN [id number] LEFT SN [id number]  DESCRIPTION OF PROCEDURE:  The patient's operative site was marked with the patient in the preoperative area. The patientwas taken to the operating room. SCDs were placed and IV antibiotics were given. The patient's operative site was prepped and draped in a sterile fashion. A time out was performed and all information was confirmed to be correct.Incision made in right inframammary fold scar and carried through superficial fascia and ADM. Expander removed. Capsulotomy performed medially. Thermal capsulorraphy preformed over lateral capsule. Additional anterior capsule scoring over lower pole completed. Sizer placed.  I then directed attention to left chest. Skin superior to IMF scar deepithelialized for entire length scar. Incision made in left inframammary fold scar and carried through superficial fascia and ADM. Expander removed. Thermal capsulorraphy performed at inframammary fold and lateral capsule. Anterior scoring capsule completed over lower pole mastectomy flap. Additional thermal capsulorraphy performed over anterior mastectomy flap  superior to NAC. Sizer placed. Patient brought to upright sitting position. Natrelle 615 ml extra projection implants selected for bilateral chest. Patient returned to supine postion.  Cavities irrigated with saline solution containing Ancef, gentamicin, and bacitracin. Hemostasis ensured. Cavities irrigated with Betadine saline solution. Implant placed in right chest. Implant orientation ensured. Closure completed with 3-0 vicryl insuperficial fascia and ADM, 4-0 vicryl in dermis, 4-0 monocryl subcuticular skin closure. Over leftchest, implant placed and orientation ensured. Closure completed with 3-0 vicryl insuperficial fascia and ADM, 4-0 vicryl in dermis, 4-0 monocryl subcuticular skin closure. Dermabond applied to breast incisions, and dry dressing, breast binder applied  The patient was allowed to wake from anesthesia, extubated and taken to the recovery room in satisfactory condition.   SPECIMENS: none  DRAINS: none  Irene Limbo, MD [facility] [phone], pin [phone]

## 2018-06-27 NOTE — Discharge Instructions (Signed)
°  Post Anesthesia Home Care Instructions  NO TYLENOL OR IBUPROFEN UNTIL AFTER 3:30pm on 06/27/18.  Activity: Get plenty of rest for the remainder of the day. A responsible individual must stay with you for 24 hours following the procedure.  For the next 24 hours, DO NOT: -Drive a car -Paediatric nurse -Drink alcoholic beverages -Take any medication unless instructed by your physician -Make any legal decisions or sign important papers.  Meals: Start with liquid foods such as gelatin or soup. Progress to regular foods as tolerated. Avoid greasy, spicy, heavy foods. If nausea and/or vomiting occur, drink only clear liquids until the nausea and/or vomiting subsides. Call your physician if vomiting continues.  Special Instructions/Symptoms: Your throat may feel dry or sore from the anesthesia or the breathing tube placed in your throat during surgery. If this causes discomfort, gargle with warm salt water. The discomfort should disappear within 24 hours.  If you had a scopolamine patch placed behind your ear for the management of post- operative nausea and/or vomiting:  1. The medication in the patch is effective for 72 hours, after which it should be removed.  Wrap patch in a tissue and discard in the trash. Wash hands thoroughly with soap and water. 2. You may remove the patch earlier than 72 hours if you experience unpleasant side effects which may include dry mouth, dizziness or visual disturbances. 3. Avoid touching the patch. Wash your hands with soap and water after contact with the patch.

## 2018-06-27 NOTE — Transfer of Care (Signed)
Immediate Anesthesia Transfer of Care Note  Patient: KAZIA GRISANTI  Procedure(s) Performed: REMOVAL OF BILATERAL TISSUE EXPANDERS WITH PLACEMENT OF BILATERAL BREAST IMPLANTS (Bilateral Breast)  Patient Location: PACU  Anesthesia Type:General  Level of Consciousness: awake, sedated and patient cooperative  Airway & Oxygen Therapy: Patient Spontanous Breathing and Patient connected to nasal cannula oxygen  Post-op Assessment: Report given to RN and Post -op Vital signs reviewed and stable  Post vital signs: Reviewed and stable  Last Vitals:  Vitals Value Taken Time  BP 131/78 06/27/18 1126  Temp    Pulse 91 06/27/18 1128  Resp 16 06/27/18 1128  SpO2 100 % 06/27/18 1128  Vitals shown include unvalidated device data.  Last Pain:  Vitals:   06/27/18 0913  TempSrc: Oral  PainSc: 0-No pain      Patients Stated Pain Goal: 0 (35/67/01 4103)  Complications: No apparent anesthesia complications

## 2018-06-27 NOTE — Anesthesia Procedure Notes (Signed)
Procedure Name: Intubation Date/Time: 06/27/2018 9:44 AM Performed by: Lyndee Leo, CRNA Pre-anesthesia Checklist: Patient identified, Emergency Drugs available, Suction available and Patient being monitored Patient Re-evaluated:Patient Re-evaluated prior to induction Oxygen Delivery Method: Circle system utilized Preoxygenation: Pre-oxygenation with 100% oxygen Induction Type: IV induction Ventilation: Mask ventilation without difficulty Laryngoscope Size: Mac and 3 Grade View: Grade I Tube type: Oral Tube size: 7.0 mm Number of attempts: 1 Airway Equipment and Method: Stylet and Oral airway Placement Confirmation: ETT inserted through vocal cords under direct vision,  positive ETCO2 and breath sounds checked- equal and bilateral Secured at: 21 cm Tube secured with: Tape Dental Injury: Teeth and Oropharynx as per pre-operative assessment

## 2018-06-28 NOTE — Anesthesia Postprocedure Evaluation (Signed)
Anesthesia Post Note  Patient: Nichole Arnold  Procedure(s) Performed: REMOVAL OF BILATERAL TISSUE EXPANDERS WITH PLACEMENT OF BILATERAL BREAST IMPLANTS (Bilateral Breast)     Patient location during evaluation: PACU Anesthesia Type: General Level of consciousness: awake and alert Pain management: pain level controlled Vital Signs Assessment: post-procedure vital signs reviewed and stable Respiratory status: spontaneous breathing, nonlabored ventilation, respiratory function stable and patient connected to nasal cannula oxygen Cardiovascular status: blood pressure returned to baseline and stable Postop Assessment: no apparent nausea or vomiting Anesthetic complications: no    Last Vitals:  Vitals:   06/27/18 1215 06/27/18 1230  BP: 126/82 119/73  Pulse: 71 70  Resp: 11 18  Temp:  37.1 C  SpO2: 100% 98%    Last Pain:  Vitals:   06/27/18 1230  TempSrc:   PainSc: 4                  Shamir Sedlar S

## 2018-06-30 ENCOUNTER — Encounter (HOSPITAL_BASED_OUTPATIENT_CLINIC_OR_DEPARTMENT_OTHER): Payer: Self-pay | Admitting: Plastic Surgery

## 2018-12-23 ENCOUNTER — Other Ambulatory Visit: Payer: Self-pay

## 2018-12-23 DIAGNOSIS — J069 Acute upper respiratory infection, unspecified: Secondary | ICD-10-CM

## 2018-12-23 MED ORDER — ALBUTEROL SULFATE HFA 108 (90 BASE) MCG/ACT IN AERS: 2.0000 | INHALATION_SPRAY | Freq: Four times a day (QID) | RESPIRATORY_TRACT | 1 refills | Status: DC | PRN

## 2018-12-23 NOTE — Telephone Encounter (Signed)
Rx sent and pt notified.

## 2018-12-23 NOTE — Telephone Encounter (Signed)
Pt left v/m requesting refill albuterol inhaler to [facility] [address]. Pt request cb when refilled.

## 2018-12-26 ENCOUNTER — Other Ambulatory Visit: Payer: Self-pay | Admitting: Family Medicine

## 2019-08-03 ENCOUNTER — Other Ambulatory Visit: Payer: Self-pay | Admitting: General Surgery

## 2020-01-05 ENCOUNTER — Encounter: Payer: Self-pay | Admitting: Family Medicine

## 2020-01-05 ENCOUNTER — Other Ambulatory Visit: Payer: Self-pay

## 2020-01-05 ENCOUNTER — Telehealth (INDEPENDENT_AMBULATORY_CARE_PROVIDER_SITE_OTHER): Payer: 59 | Admitting: Family Medicine

## 2020-01-05 ENCOUNTER — Other Ambulatory Visit: Payer: Self-pay | Admitting: Family Medicine

## 2020-01-05 VITALS — HR 79 | Temp 98.0°F

## 2020-01-05 DIAGNOSIS — J209 Acute bronchitis, unspecified: Secondary | ICD-10-CM | POA: Diagnosis not present

## 2020-01-05 DIAGNOSIS — J069 Acute upper respiratory infection, unspecified: Secondary | ICD-10-CM

## 2020-01-05 MED ORDER — PREDNISONE 10 MG PO TABS: ORAL_TABLET | ORAL | 0 refills | Status: DC

## 2020-01-05 MED ORDER — ALBUTEROL SULFATE HFA 108 (90 BASE) MCG/ACT IN AERS: 2.0000 | INHALATION_SPRAY | Freq: Four times a day (QID) | RESPIRATORY_TRACT | 1 refills | Status: DC | PRN

## 2020-01-05 MED ORDER — HYDROCOD POLST-CPM POLST ER 10-8 MG/5ML PO SUER: 5.0000 mL | Freq: Two times a day (BID) | ORAL | 0 refills | Status: DC | PRN

## 2020-01-05 NOTE — Progress Notes (Signed)
Virtual Visit via Telephone Note  I connected with Nichole Arnold on 01/05/20 at  8:00 AM EST by telephone and verified that I am speaking with the correct person using two identifiers.  Location: Patient: home Provider: office    I discussed the limitations, risks, security and privacy concerns of performing an evaluation and management service by telephone and the availability of in person appointments. I also discussed with the patient that there may be a patient responsible charge related to this service. The patient expressed understanding and agreed to proceed.  Parties involved in encounter  Patient: Nichole Arnold  Provider:  Loura Pardon MD    History of Present Illness: Pt presents for uri symptoms   Bunch of family/ co workers have colds -no covid   Started fri with mild ST Head congestion on Saturday  Now a bad cough/ feels like bronchitis Low grade temp 99-100  Had a covid test yesterday -pend results  Also pending flu test   No body aches or chills  Throat is ok  Some runny nose  No sinus pain  Ears feel ok   No loss of taste or smell Dry cough so far, in spasms    No n/v/d   Not really wheezing  02 sat is 96%   Not vaccinated for flu or covid    Otc:-nothing so far  Avoids talking Drinking hot liquids   Has albuterol to use prn   Has not needed advair for a while- on med list  She cut out some foods she was allergic to (sensitive to wheat, american cheese, asparagus, corn, cows milk, soybean)     Patient Active Problem List   Diagnosis Date Noted  . Breast neoplasm, Tis (DCIS), right 03/18/2018  . Acute bronchitis 02/17/2018  . Breast cancer, stage 0 02/17/2018  . Elevated glucose level 06/13/2017  . Screen for STD (sexually transmitted disease) 04/22/2017  . Obese 04/22/2017  . Hyperlipidemia 03/01/2016  . Atypical ductal hyperplasia of right breast 01/12/2015  . Breast cancer screening, high risk patient 01/12/2015  . Family  history- stomach cancer 08/11/2012  . Anxiety disorder 08/11/2012  . Routine general medical examination at a health care facility 08/05/2012  . Unspecified family circumstance 11/09/2010  . ALLERGIC RHINITIS 08/05/2006  . Asthma 08/05/2006  . SKIN CANCER, HX OF 08/05/2006   Past Medical History:  Diagnosis Date  . Allergic rhinitis   . Allergy   . Anxiety   . Asthma    allergy induced  . Basal cell carcinoma of ear    Left  . Basal cell carcinoma of forehead   . Benign paroxysmal positional vertigo   . Bronchitis 02/17/2018   Past Surgical History:  Procedure Laterality Date  . BASAL CELL CARCINOMA EXCISION  9/08   left ear  . BREAST RECONSTRUCTION WITH PLACEMENT OF TISSUE EXPANDER AND ALLODERM Bilateral 03/18/2018   Procedure: BILATERAL BREAST RECONSTRUCTION WITH PLACEMENT OF TISSUE EXPANDER AND ALLODERM;  Surgeon: Irene Limbo, MD;  Location: [facility];  Service: Plastics;  Laterality: Bilateral;  . BREAST SURGERY    . CERVICAL CONE BIOPSY    . CESAREAN SECTION  12/00  . MELANOMA EXCISION     pre melanoma  . NEUROMA SURGERY  6/02   finger  . NIPPLE SPARING MASTECTOMY WITH SENTINEL LYMPH NODE BIOPSY Bilateral 03/18/2018   Procedure: RIGHT NIPPLE SPARING MASTECTOMY WITH RIGHT AXILLARY SENTINEL LYMPH NODE BIOPSY AND LEFT RISK REDUCING NIPPLE SPARING MASTECTOMY;  Surgeon: Rolm Bookbinder, MD;  Location: [facility];  Service: General;  Laterality: Bilateral;  . RADIOACTIVE SEED GUIDED EXCISIONAL BREAST BIOPSY Right 12/08/2014   Procedure: RADIOACTIVE SEED GUIDED EXCISIONAL BREAST BIOPSY;  Surgeon: Emelia Loron, MD;  Location: [facility];  Service: General;  Laterality: Right;  . REMOVAL OF BILATERAL TISSUE EXPANDERS WITH PLACEMENT OF BILATERAL BREAST IMPLANTS Bilateral 06/27/2018   Procedure: REMOVAL OF BILATERAL TISSUE EXPANDERS WITH PLACEMENT OF BILATERAL BREAST IMPLANTS;  Surgeon: Glenna Fellows, MD;  Location: [facility];  Service: Plastics;  Laterality: Bilateral;   Social History   Tobacco Use  . Smoking status: Never Smoker  . Smokeless tobacco: Never Used  Vaping Use  . Vaping Use: Never used  Substance Use Topics  . Alcohol use: Yes    Alcohol/week: 0.0 standard drinks    Comment: Occasional/ Social  . Drug use: No   Family History  Problem Relation Age of Onset  . Asthma Mother   . Stomach cancer Mother   . Heart failure Other        GM  . Stomach cancer Other        GF  . Breast cancer Maternal Grandmother 63  . Uterine cancer Maternal Grandmother   . Colon polyps Father   . Stomach cancer Maternal Grandfather   . Healthy Sister    No Known Allergies Current Outpatient Medications on File Prior to Visit  Medication Sig Dispense Refill  . ADVAIR DISKUS 100-50 MCG/DOSE AEPB INHALE 1 PUFF INTO THE LUNGS EVERY 12 (TWELVE) HOURS. RINSE MOUTH AFTER EACH USE 180 each 0  . BLACK COHOSH COMPOUND PO Take by mouth.    . cetirizine (ZYRTEC) 10 MG tablet Take 10 mg by mouth daily as needed.     Marland Kitchen ibuprofen (ADVIL,MOTRIN) 200 MG tablet Take 800 mg by mouth every 6 (six) hours as needed for pain. Reported on 01/12/2015    . naproxen sodium (ALEVE) 220 MG tablet Take 220 mg by mouth.    . NON FORMULARY Supplement for menopause     No current facility-administered medications on file prior to visit.    Observations/Objective: Pt sounds hoarse but not distressed  occ barking cough  No wheeze or sob audible Nl cognition-good historian Nl mood   Assessment and Plan: Problem List Items Addressed This Visit      Respiratory   Acute bronchitis - Primary    With cough (h/o reactive airways) and nasal symptoms Had covid and flu tests yesterday -will call with results Treat symptoms  Rest/fluids Albuterol prn wheeze/cough Prednisone as directed and tussionex prn cough with caution of sedation  inst to call/go to ER if suddenly worse or sob Enc to consider covid immunization   Update if not starting to improve in a week or if worsening    Meds ordered this encounter  Medications  . predniSONE (DELTASONE) 10 MG tablet    Sig: Take 4 pills once daily by mouth for 3 days, then 3 pills daily for 3 days, then 2 pills daily for 3 days then 1 pill daily for 3 days then stop    Dispense:  30 tablet    Refill:  0  . chlorpheniramine-HYDROcodone (TUSSIONEX PENNKINETIC ER) 10-8 MG/5ML SUER    Sig: Take 5 mLs by mouth every 12 (twelve) hours as needed for cough. Caution of sedation    Dispense:  60 mL    Refill:  0  . albuterol (VENTOLIN HFA) 108 (90 Base) MCG/ACT inhaler    Sig:  Inhale 2 puffs into the lungs every 6 (six) hours as needed for wheezing or shortness of breath.    Dispense:  6.7 g    Refill:  1          Other Visit Diagnoses    Viral URI with cough       Relevant Medications   albuterol (VENTOLIN HFA) 108 (90 Base) MCG/ACT inhaler       Follow Up Instructions: Drink fluids and rest  Take prednisone as directed Albuterol as needed for cough or wheezing  tussionex for cough as needed caution of sedation   Isolate until negative covid test   If symptoms suddenly worsen please alert [place] and go to ER if needed   I discussed the assessment and treatment plan with the patient. The patient was provided an opportunity to ask questions and all were answered. The patient agreed with the plan and demonstrated an understanding of the instructions.   The patient was advised to call back or seek an in-person evaluation if the symptoms worsen or if the condition fails to improve as anticipated.  I provided 17 minutes of non-face-to-face time during this encounter.   Loura Pardon, MD

## 2020-01-05 NOTE — Telephone Encounter (Signed)
Insurance doesn't cover Ventolin inhaler but covers [employer] account planner, Rx sent

## 2020-01-05 NOTE — Assessment & Plan Note (Signed)
With cough (h/o reactive airways) and nasal symptoms Had covid and flu tests yesterday -will call with results Treat symptoms  Rest/fluids Albuterol prn wheeze/cough Prednisone as directed and tussionex prn cough with caution of sedation  inst to call/go to ER if suddenly worse or sob Enc to consider covid immunization  Update if not starting to improve in a week or if worsening    Meds ordered this encounter  Medications  . predniSONE (DELTASONE) 10 MG tablet    Sig: Take 4 pills once daily by mouth for 3 days, then 3 pills daily for 3 days, then 2 pills daily for 3 days then 1 pill daily for 3 days then stop    Dispense:  30 tablet    Refill:  0  . chlorpheniramine-HYDROcodone (TUSSIONEX PENNKINETIC ER) 10-8 MG/5ML SUER    Sig: Take 5 mLs by mouth every 12 (twelve) hours as needed for cough. Caution of sedation    Dispense:  60 mL    Refill:  0  . albuterol (VENTOLIN HFA) 108 (90 Base) MCG/ACT inhaler    Sig: Inhale 2 puffs into the lungs every 6 (six) hours as needed for wheezing or shortness of breath.    Dispense:  6.7 g    Refill:  1

## 2020-01-05 NOTE — Patient Instructions (Addendum)
Drink fluids and rest  Take prednisone as directed Albuterol as needed for cough or wheezing  tussionex for cough as needed caution of sedation   Isolate until negative covid test   If symptoms suddenly worsen please alert [place] and go to ER if needed

## 2020-01-14 ENCOUNTER — Telehealth: Payer: Self-pay | Admitting: Family Medicine

## 2020-01-14 MED ORDER — HYDROCOD POLST-CPM POLST ER 10-8 MG/5ML PO SUER: 5.0000 mL | Freq: Two times a day (BID) | ORAL | 0 refills | Status: DC | PRN

## 2020-01-14 NOTE — Telephone Encounter (Signed)
Patient needs a refill on the following medicine: Chlorpheniramine hydrocodone   [facility] [address] [place]

## 2020-01-14 NOTE — Addendum Note (Signed)
Addended by: Roxy Manns A on: 01/14/2020 11:17 AM   Modules accepted: Orders

## 2020-01-14 NOTE — Telephone Encounter (Signed)
Dr. Milinda Antis - please advise on refill. Thanks.

## 2020-01-14 NOTE — Telephone Encounter (Signed)
I sent it  

## 2020-02-17 ENCOUNTER — Telehealth (INDEPENDENT_AMBULATORY_CARE_PROVIDER_SITE_OTHER): Payer: 59 | Admitting: Family Medicine

## 2020-02-17 ENCOUNTER — Encounter: Payer: Self-pay | Admitting: Family Medicine

## 2020-02-17 VITALS — Ht 70.0 in

## 2020-02-17 DIAGNOSIS — J069 Acute upper respiratory infection, unspecified: Secondary | ICD-10-CM

## 2020-02-17 LAB — POC INFLUENZA A&B (BINAX/QUICKVUE)
Influenza A, POC: NEGATIVE
Influenza B, POC: NEGATIVE

## 2020-02-17 NOTE — Progress Notes (Addendum)
Virtual Visit via Video Note I connected with [name] on 02/17/20 by a video enabled telemedicine application and verified that I am speaking with the correct person using two identifiers.  Location patient: home Location provider:work office Persons participating in the virtual visit: patient, provider  I discussed the limitations of evaluation and management by telemedicine and the availability of in person appointments. The patient expressed understanding and agreed to proceed.  Chief Complaint  Patient presents with  . flu like symptoms   HPI: Nichole Arnold is a 55 year old female with history of allergic rhinitis,asthma,and HLD , who started with respiratory symptoms this morning. Temp 100 F and 101.0 F  She has similar symptoms in 2017, when she was diagnosed with influenza. Sore/scratchy throat, headache, fever, chills, body aches, some nasal congestion, and fatigue. Negative for cough, wheezing, dyspnea, or CP. Negative for anosmia and ageusia.  + Nausea. She has not noted abdominal pain, vomiting, or changes in bowel habits. Currently she is on active 100-50 mcg twice daily and albuterol as needed. Taking ibuprofen as needed.  She would like to have a flu test, so she can get Tamiflu if appropriate.  Negative for sick contacts or recent travel. Reporting negative home COVID-19 testing.  COVID-19 vaccination: Not completed.  ROS: See pertinent positives and negatives per HPI.  Past Medical History:  Diagnosis Date  . Allergic rhinitis   . Allergy   . Anxiety   . Asthma    allergy induced  . Basal cell carcinoma of ear    Left  . Basal cell carcinoma of forehead   . Benign paroxysmal positional vertigo   . Bronchitis 02/17/2018    Past Surgical History:  Procedure Laterality Date  . BASAL CELL CARCINOMA EXCISION  9/08   left ear  . BREAST RECONSTRUCTION WITH PLACEMENT OF TISSUE EXPANDER AND ALLODERM Bilateral 03/18/2018   Procedure: BILATERAL BREAST  RECONSTRUCTION WITH PLACEMENT OF TISSUE EXPANDER AND ALLODERM;  Surgeon: Irene Limbo, MD;  Location: [place];  Service: Plastics;  Laterality: Bilateral;  . BREAST SURGERY    . CERVICAL CONE BIOPSY    . CESAREAN SECTION  12/00  . MELANOMA EXCISION     pre melanoma  . NEUROMA SURGERY  6/02   finger  . NIPPLE SPARING MASTECTOMY WITH SENTINEL LYMPH NODE BIOPSY Bilateral 03/18/2018   Procedure: RIGHT NIPPLE SPARING MASTECTOMY WITH RIGHT AXILLARY SENTINEL LYMPH NODE BIOPSY AND LEFT RISK REDUCING NIPPLE SPARING MASTECTOMY;  Surgeon: Rolm Bookbinder, MD;  Location: [facility];  Service: General;  Laterality: Bilateral;  . RADIOACTIVE SEED GUIDED EXCISIONAL BREAST BIOPSY Right 12/08/2014   Procedure: RADIOACTIVE SEED GUIDED EXCISIONAL BREAST BIOPSY;  Surgeon: Rolm Bookbinder, MD;  Location: [facility];  Service: General;  Laterality: Right;  . REMOVAL OF BILATERAL TISSUE EXPANDERS WITH PLACEMENT OF BILATERAL BREAST IMPLANTS Bilateral 06/27/2018   Procedure: REMOVAL OF BILATERAL TISSUE EXPANDERS WITH PLACEMENT OF BILATERAL BREAST IMPLANTS;  Surgeon: Irene Limbo, MD;  Location: [facility];  Service: Plastics;  Laterality: Bilateral;    Family History  Problem Relation Age of Onset  . Asthma Mother   . Stomach cancer Mother   . Heart failure Other        GM  . Stomach cancer Other        GF  . Breast cancer Maternal Grandmother 47  . Uterine cancer Maternal Grandmother   . Colon polyps Father   . Stomach cancer Maternal Grandfather   . Healthy Sister  Social History   Socioeconomic History  . Marital status: Divorced    Spouse name: Not on file  . Number of children: Not on file  . Years of education: Not on file  . Highest education level: Not on file  Occupational History    Employer: OTHER    Comment: [employer]  Tobacco Use  . Smoking status: Never Smoker  . Smokeless tobacco: Never Used  Vaping  Use  . Vaping Use: Never used  Substance and Sexual Activity  . Alcohol use: Yes    Alcohol/week: 0.0 standard drinks    Comment: Occasional/ Social  . Drug use: No  . Sexual activity: Yes    Birth control/protection: None  Other Topics Concern  . Not on file  Social History Narrative   Married      Works at [employer] Strain: Not on [employer] Insecurity: Not on file  Transportation Needs: Not on file  Physical Activity: Not on file  Stress: Not on file  Social Connections: Not on file  Intimate Partner Violence: Not on file    Current Outpatient Medications:  .  ADVAIR DISKUS 100-50 MCG/DOSE AEPB, INHALE 1 PUFF INTO THE LUNGS EVERY 12 (TWELVE) HOURS. RINSE MOUTH AFTER EACH USE, Disp: 180 each, Rfl: 0 .  albuterol (PROAIR HFA) 108 (90 Base) MCG/ACT inhaler, Inhale 2 puffs into the lungs every 6 (six) hours as needed for wheezing or shortness of breath., Disp: 18 g, Rfl: 1 .  BLACK COHOSH COMPOUND PO, Take by mouth., Disp: , Rfl:  .  cetirizine (ZYRTEC) 10 MG tablet, Take 10 mg by mouth daily as needed. , Disp: , Rfl:  .  chlorpheniramine-HYDROcodone (TUSSIONEX PENNKINETIC ER) 10-8 MG/5ML SUER, Take 5 mLs by mouth every 12 (twelve) hours as needed for cough. Caution of sedation, Disp: 60 mL, Rfl: 0 .  ibuprofen (ADVIL,MOTRIN) 200 MG tablet, Take 800 mg by mouth every 6 (six) hours as needed for pain. Reported on 01/12/2015, Disp: , Rfl:  .  naproxen sodium (ALEVE) 220 MG tablet, Take 220 mg by mouth., Disp: , Rfl:  .  NON FORMULARY, Supplement for menopause, Disp: , Rfl:  .  predniSONE (DELTASONE) 10 MG tablet, Take 4 pills once daily by mouth for 3 days, then 3 pills daily for 3 days, then 2 pills daily for 3 days then 1 pill daily for 3 days then stop, Disp: 30 tablet, Rfl: 0  EXAM:  VITALS per patient if applicable:Ht 5\' 10"  (1.778 m)   BMI 31.85 kg/m   GENERAL: alert, oriented, appears well and in no acute  distress  HEENT: atraumatic, conjunctiva clear, no obvious abnormalities on inspection of external nose and ears Nasal voice.  NECK: normal movements of the head and neck  LUNGS: on inspection no signs of respiratory distress, breathing rate appears normal, no obvious gross SOB, gasping or wheezing  CV: no obvious cyanosis  MS: moves all visible extremities without noticeable abnormality  PSYCH/NEURO: pleasant and cooperative, no obvious depression or anxiety, speech and thought processing grossly intact  ASSESSMENT AND PLAN:  Discussed the following assessment and plan:  URI, acute - Plan: COVID-19, Flu A+B and RSV, POC Influenza A&B(BINAX/QUICKVUE) We discussed possible etiologies. Symptoms suggest a viral illness. She came to the parking lot, I did collect a sample for COVID-19 and rapid flu, the latter was negative. Flu testing was also sent out. For now recommend symptomatic treatment. Monitor for new symptoms.  She prefers to hold on pharmacologic treatment for nausea, she was instructed to let me know if problem gets worse, in which case we can send Zofran to the pharmacy. Contact precautions. Adequate hydration and rest. Instructed about warning signs.  We discussed possible serious and likely etiologies, options for evaluation and workup. She understands limitations of telemedicine visit vs in person visit, treatment, treatment risks and precautions.  I discussed the assessment and treatment plan with the patient. Nichole Arnold was provided an opportunity to ask questions and all were answered.She agreed with the plan and demonstrated an understanding of the instructions.  Return if symptoms worsen or fail to improve.  Betty Martinique, MD

## 2020-02-18 LAB — COVID-19, FLU A+B AND RSV
Influenza A, NAA: NOT DETECTED
Influenza B, NAA: NOT DETECTED
RSV, NAA: NOT DETECTED
SARS-CoV-2, NAA: DETECTED — AB

## 2020-03-02 IMAGING — MR MR BREAST BX W LOC DEV 1ST LESION IMAGE BX SPEC MR GUIDE*R*
9 of 12 series · 33 of 48 positions shown · IV contrast (10m gadavist)
Comparison: Previous exams.

Addendum:
CLINICAL DATA: A segmental area of non mass enhancement in the
right breast. Three foci within the area of segmental non-mass
enhancement were recommended for an MRI guided core needle biopsy.
The patient has a prior history of excisional biopsy of the right
breast demonstrating ADH and ALH.

EXAM:
MRI GUIDED CORE NEEDLE BIOPSY OF THE RIGHT BREAST
TECHNIQUE: Multiplanar, multisequence MR imaging of the the right breast was
performed both before and after administration of intravenous
contrast.
CONTRAST:  10 cc Gadavist

[Series 2: fiducial unilateral · sagittal · 2.0mm · 1.33mm/px · 3 of 52 slices shown]
[im 1/52]
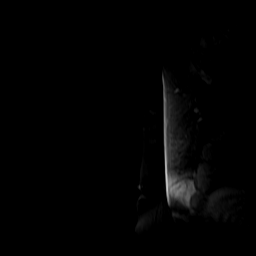
[im 26/52]
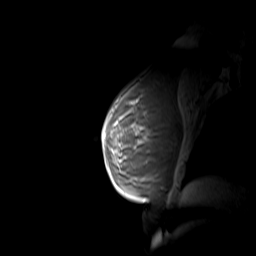
[im 52/52]
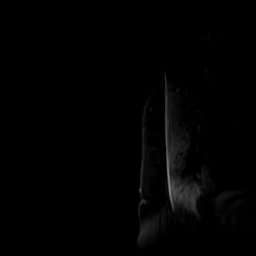

[Series 3: dynamic pre · axial · non-contrast · 1.3mm · 0.73mm/px · z∈[-92,+93]mm · 5 of 144 slices shown]
[im 1/144]
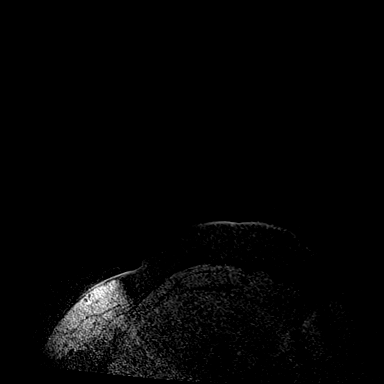
[im 36/144]
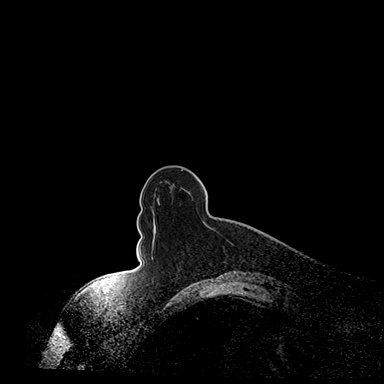
[im 72/144]
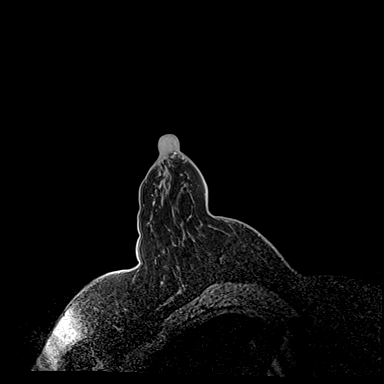
[im 108/144]
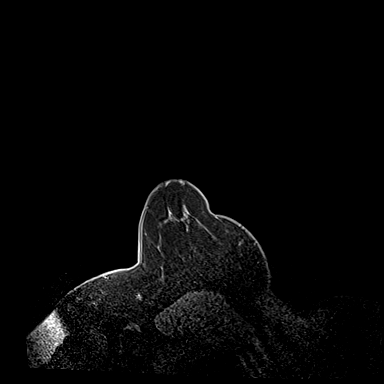
[im 144/144]
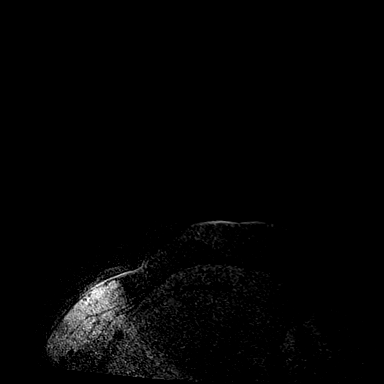

[Series 4: dynamic post 20 · axial · 1.3mm · 0.73mm/px · z∈[-92,+93]mm · 4 of 144 slices shown (1 of 2)]
[im 1/144]
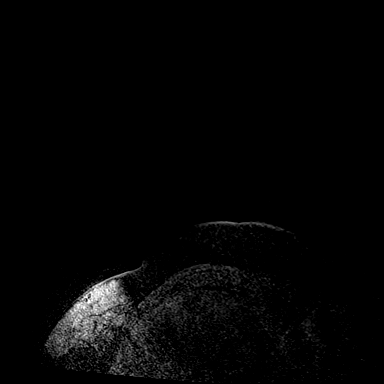
[im 48/144]
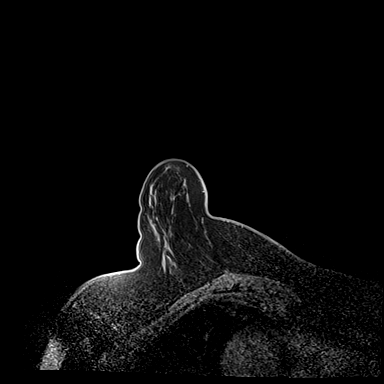
[im 96/144]
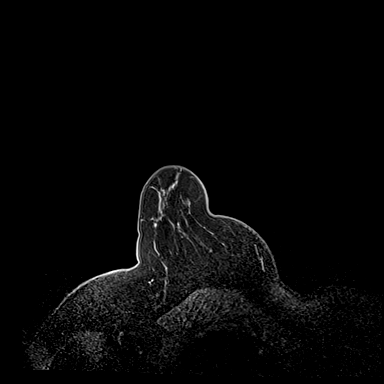
[im 144/144]
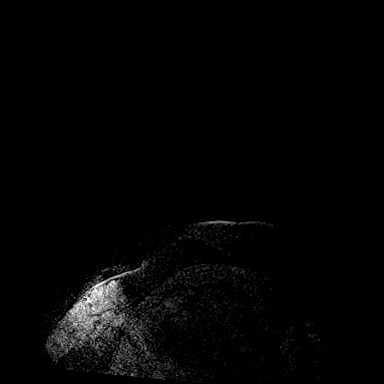

[Series 5: dynamic post 20 · axial · 1.3mm · 0.73mm/px · z∈[-92,+93]mm · 4 of 144 slices shown (2 of 2)]
[im 1/144]
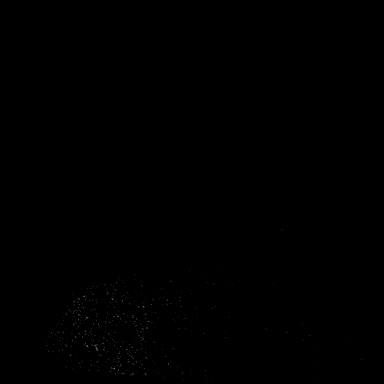
[im 48/144]
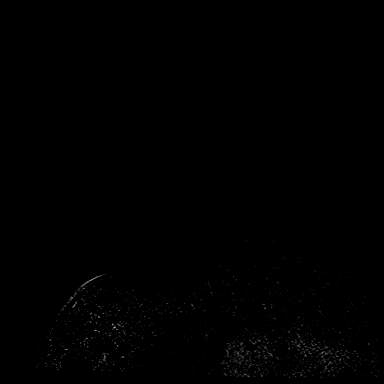
[im 96/144]
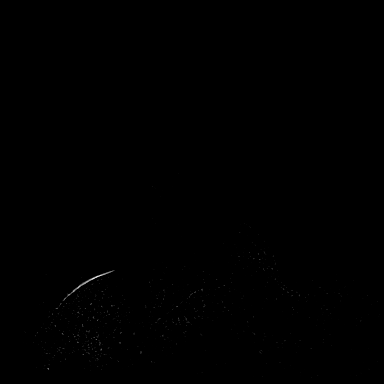
[im 144/144]
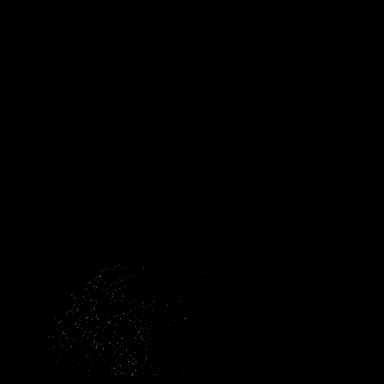

[Series 6: dynamic post 3 · axial · 1.3mm · 0.73mm/px · z∈[-92,+93]mm · 4 of 144 slices shown (1 of 2)]
[im 1/144]
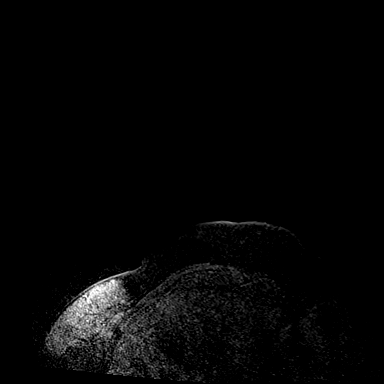
[im 48/144]
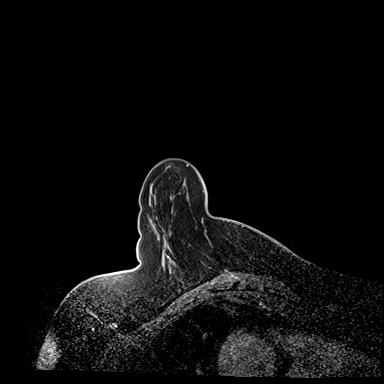
[im 96/144]
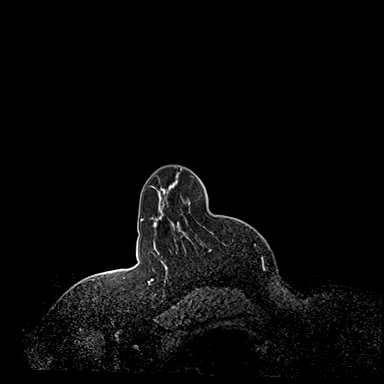
[im 144/144]
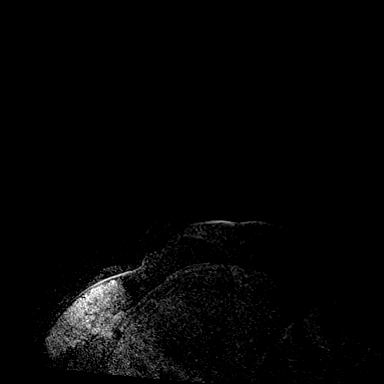

[Series 7: dynamic post 3 · axial · 1.3mm · 0.73mm/px · z∈[-92,+93]mm · 4 of 144 slices shown (2 of 2)]
[im 1/144]
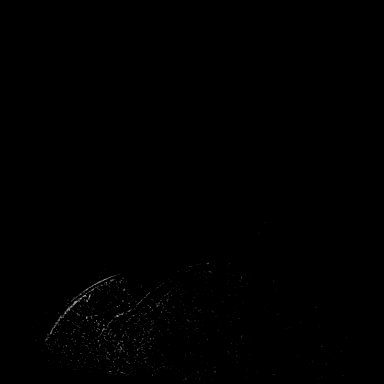
[im 48/144]
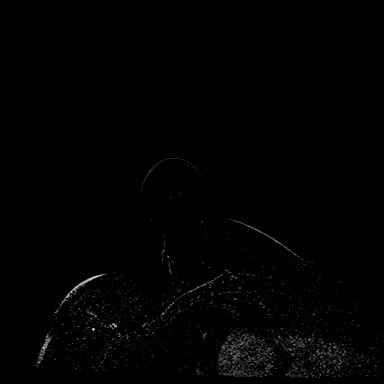
[im 96/144]
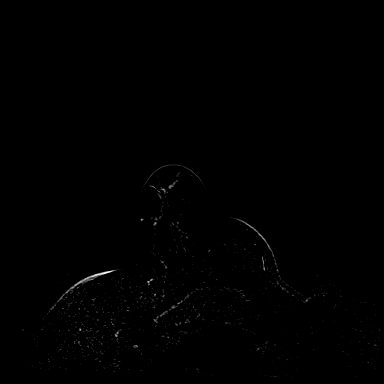
[im 144/144]
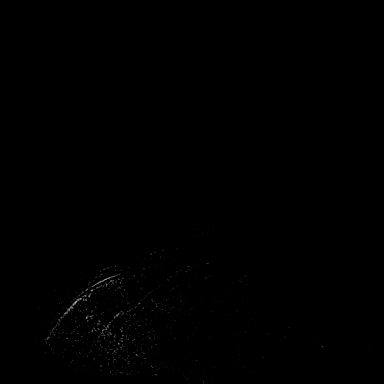

[Series 8: dynamic post 5 · axial · 1.3mm · 0.73mm/px · z∈[-92,+93]mm · 4 of 144 slices shown (1 of 2)]
[im 1/144]
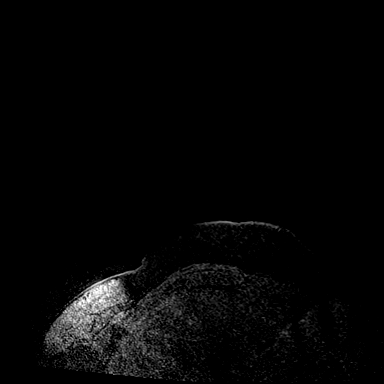
[im 48/144]
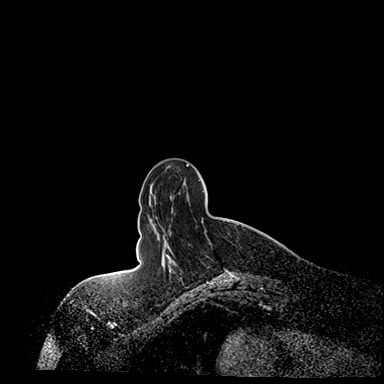
[im 96/144]
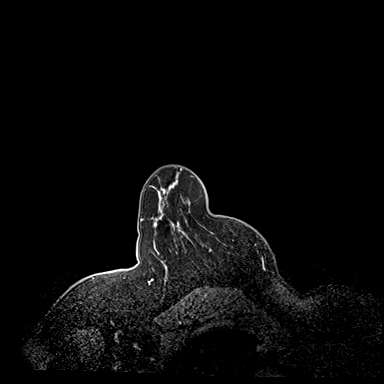
[im 144/144]
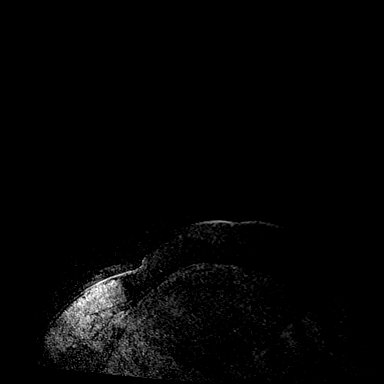

[Series 9: dynamic post 5 · axial · 1.3mm · 0.73mm/px · z∈[-92,+93]mm · 4 of 144 slices shown (2 of 2)]
[im 1/144]
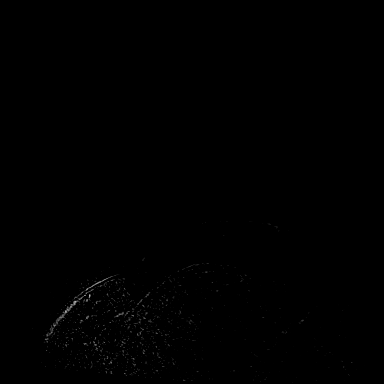
[im 48/144]
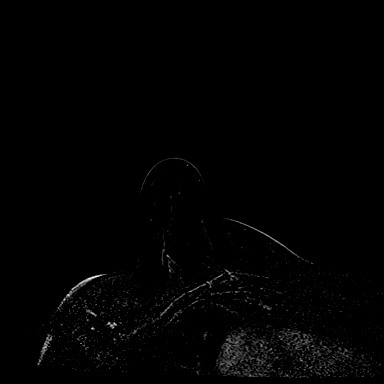
[im 96/144]
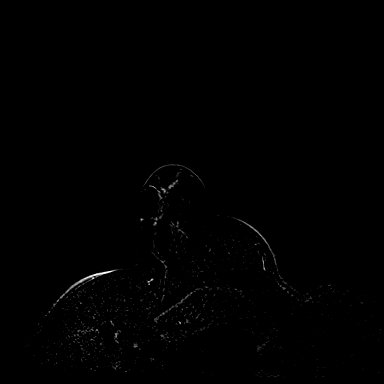
[im 144/144]
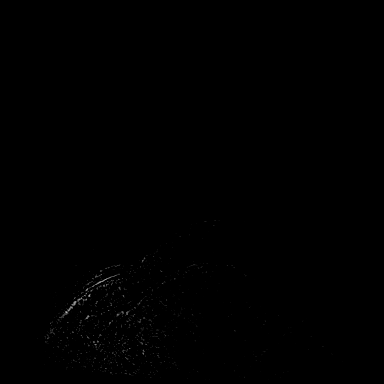

[Series 10: needle confirmation · axial · 1.3mm · 0.73mm/px · 1 of 144 slices shown]
[im 1/144]
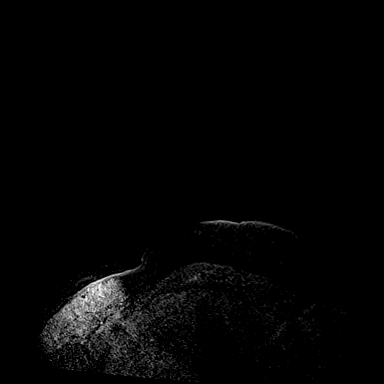

[33 of 48 positions shown; findings below may reference images not displayed]

FINDINGS: I met with the patient, and we discussed the procedure of MRI guided
biopsy, including risks, benefits, and alternatives. Specifically,
we discussed the risks of infection, bleeding, tissue injury, clip
migration, and inadequate sampling. Informed, written consent was
given. The usual time out protocol was performed immediately prior
to the procedure.

Using sterile technique, 1% Lidocaine, MRI guidance, and a 9 gauge
vacuum assisted device, biopsy was performed of an area of non mass
enhancement in the right outer central breast using a lateral
approach. At the conclusion of the procedure, a barbell tissue
marker clip was deployed into the biopsy cavity.

Next, using sterile technique, 1% Lidocaine, MRI guidance, and a 9
gauge vacuum assisted device, biopsy was performed of an area of non
mass enhancement in the right breast upper outer quadrant using a
lateral approach. At the conclusion of the procedure, a cylinder
tissue marker clip was deployed into the biopsy cavity.

Next, using sterile technique, 1% Lidocaine, MRI guidance, and a 9
gauge vacuum assisted device, biopsy was performed of an area of non
mass enhancement in the right upper central breast using a lateral
approach. At the conclusion of the procedure, a barbell tissue
marker clip was deployed into the biopsy cavity.

Follow-up 2-view mammogram was performed and dictated separately.
IMPRESSION: MRI guided biopsy of the right breast.  No apparent complications.

ADDENDUM:
Pathology revealed INTERMEDIATE GRADE DUCTAL CARCINOMA IN SITU of
the Right breast, all three locations, outer central non mass
enhancement, upper outer non mass enhancement and upper central non
mass enhancement. This was found to be concordant by Dr. Erika
Stella.

Pathology results were discussed with the patient by telephone. The
patient reported doing well after the biopsies with tenderness and
minimal bleeding at the sites. Post biopsy instructions and care
were reviewed and questions were answered. The patient was
encouraged to call The [REDACTED] for any
additional concerns.

Surgical consultation has been arranged with Dr. Nivirus Databex,
per patient request, at [REDACTED] on January 21, 2018.

The patient is scheduled for an ultrasound guided biopsy of the
Right breast on January 23, 2018. The patient was instructed to
discuss surgical treatment options with Dr. Tiger at her
appointment on [DATE]. If breast conservation is a consideration
the patient was instructed to keep the biopsy appointment on [place]

Pathology results reported by Modhame Menwela, RN on 01/17/2018.

*** End of Addendum ***

## 2020-03-29 ENCOUNTER — Telehealth: Payer: Self-pay

## 2020-03-29 ENCOUNTER — Telehealth (INDEPENDENT_AMBULATORY_CARE_PROVIDER_SITE_OTHER): Payer: 59 | Admitting: Family Medicine

## 2020-03-29 DIAGNOSIS — R059 Cough, unspecified: Secondary | ICD-10-CM

## 2020-03-29 DIAGNOSIS — R0981 Nasal congestion: Secondary | ICD-10-CM

## 2020-03-29 NOTE — Telephone Encounter (Signed)
[facility] - Client TELEPHONE ADVICE RECORD [facility] Patient Name: Nichole Arnold Gender: Female DOB: 05/13/1965 Age: 55 Y 72 D Return Phone Number: [phone] (Primary) Address: City/State/Zip: [place] [address] Client [facility] - Client Client Site [place] MD Contact Type Call Who Is Calling Patient / Member / Family / Caregiver Call Type Triage / Clinical Relationship To Patient Self Return Phone Number [phone] (Primary) Chief Complaint Headache Reason for Call Symptomatic / Request for [place] states she is experiencing a sore throat,congestion ,headache,nausea and fever of Temp. 101. Caller was transferred from the office. Caller has virtual appt. today at 1030am with Colin Benton at the [place] location. Translation No Nurse Assessment Nurse: Claiborne Billings, RN, Kim Date/Time (Eastern Time): 03/29/2020 9:00:59 AM Confirm and document reason for call. If symptomatic, describe symptoms. ---Caller states she has sore throat, congestion, headache, nausea and fever. Sxs started last night, current temp is 101, no fever reducer since last night at bedtime. Headache pain is 6/10. Caller was transferred from the office. Caller has virtual appt. today at 1030am with Colin Benton at the [place] location. Does the patient have any new or worsening symptoms? ---Yes Will a triage be completed? ---Yes Related visit to physician within the last 2 weeks? ---No Does the PT have any chronic conditions? (i.e. diabetes, asthma, this includes High risk factors for pregnancy, etc.) ---No Is the patient pregnant or possibly pregnant? (Ask all females between the ages of 21-55) ---No Is this a behavioral health or substance abuse call? ---No Guidelines Guideline Title Affirmed Question Affirmed Notes Nurse Date/Time  (Eastern Time) COVID-19 - Diagnosed or Suspected [1] COVID-19 infection suspected by caller or triager AND [2] mild symptoms (cough, fever, or others) AND [3] has not gotten tested yet Claiborne Billings, Therapist, sports, Maudie Mercury 03/29/2020 9:03:16 AM Disp. Time Eilene Ghazi Time) Disposition Final User PLEASE NOTE: All timestamps contained within this report are represented as [place] Standard Time. CONFIDENTIALTY NOTICE: This fax transmission is intended only for the addressee. It contains information that is legally privileged, confidential or otherwise protected from use or disclosure. If you are not the intended recipient, you are strictly prohibited from reviewing, disclosing, copying using or disseminating any of this information or taking any action in reliance on or regarding this information. If you have received this fax in error, please notify us immediately by telephone so that we can arrange for its return to [place]. Phone: [phone], Toll-Free: [phone], Fax: [phone] Page: 2 of 2 Call Id: [id number] 03/29/2020 9:11:27 AM Home Care Yes Claiborne Billings, RN, Max Sane Disagree/Comply Comply Caller Understands Yes PreDisposition Call Doctor Care Advice Given Per Guideline HOME CARE: * You should be able to treat this at home. NOTE TO TRIAGER - COVID-19 TESTING: * FOR QUESTIONS ABOUT TESTING, it is often best to DIRECT THE PATIENT TO THEIR DOCTOR (or NP/PA), during office hours. Their doctor is the best resource for up-to-date information on testing. Testing in a lab requires a doctor's order (as with all medical tests). GENERAL CARE ADVICE FOR COVID-19 SYMPTOMS: * Cough: Use cough drops. * Feeling dehydrated: Drink extra liquids. If the air in your home is dry, use a humidifier. * Fever: For fever over 101 F (38.3 C), take acetaminophen every 4 to 6 hours (Adults 650 mg) OR ibuprofen every 6 to 8 hours (Adults 400 mg). Before taking any medicine, read all the instructions on the package. Do not take  aspirin unless  your doctor has prescribed it for you. * Muscle aches, headache, and other pains: Often this comes and goes with the fever. Take acetaminophen every 4 to 6 hours (Adults 650 mg) OR ibuprofen every 6 to 8 hours (Adults 400 mg). Before taking any medicine, read all the instructions on the package. * Sore throat: Try throat lozenges, hard candy or warm chicken broth. CALL BACK IF: * Fever over 103 F (39.4 C) * Chest pain or difficulty breathing occurs * You become worse CARE ADVICE given per COVID-19 - DIAGNOSED OR SUSPECTED (Adult) guideline. Comments User: Suezanne Jacquet, RN Date/Time Eilene Ghazi Time): 03/29/2020 9:13:30 AM Caller has virtual appt today at 10:30am, states she will ask at that time if she needs to go somewhere for Covid or flu testing. States when she gets sick like this, she usually gets bronchitis. She is advised to follow care instructions given at appt and call back for worsening sxs such as difficulty breathing, shortness of breath, chest pain or fever > 103 and she verbalizes understanding.

## 2020-03-29 NOTE — Progress Notes (Signed)
Virtual Visit via Video Note  I connected with Ravon  on 03/29/20 at 10:40 AM EDT by a video enabled telemedicine application and verified that I am speaking with the correct person using two identifiers.  Location patient: home,  Location provider:work or home office Persons participating in the virtual visit: patient, provider  I discussed the limitations of evaluation and management by telemedicine and the availability of in person appointments. The patient expressed understanding and agreed to proceed.   HPI:  Acute telemedicine visit for flu like symptoms: -Onset:this morning -Symptoms include: nasal congestion, cough, scratchy throat, low grade fever, headache, mild body aches, feels tired -Denies:CP, SOB, vomiting or diarrhea, inability to drink/eat/get out of bed -Pertinent past medical history:allergies, reports asthma is remote and has not required inhalers in a long time  -Pertinent medication allergies: nkda -COVID-19 vaccine status: not vaccinated for covid or flu -report she had covid in February about 1 month ago  ROS: See pertinent positives and negatives per HPI.  Past Medical History:  Diagnosis Date  . Allergic rhinitis   . Allergy   . Anxiety   . Asthma    allergy induced  . Basal cell carcinoma of ear    Left  . Basal cell carcinoma of forehead   . Benign paroxysmal positional vertigo   . Bronchitis 02/17/2018    Past Surgical History:  Procedure Laterality Date  . BASAL CELL CARCINOMA EXCISION  9/08   left ear  . BREAST RECONSTRUCTION WITH PLACEMENT OF TISSUE EXPANDER AND ALLODERM Bilateral 03/18/2018   Procedure: BILATERAL BREAST RECONSTRUCTION WITH PLACEMENT OF TISSUE EXPANDER AND ALLODERM;  Surgeon: Irene Limbo, MD;  Location: [facility];  Service: Plastics;  Laterality: Bilateral;  . BREAST SURGERY    . CERVICAL CONE BIOPSY    . CESAREAN SECTION  12/00  . MELANOMA EXCISION     pre melanoma  . NEUROMA SURGERY  6/02    finger  . NIPPLE SPARING MASTECTOMY WITH SENTINEL LYMPH NODE BIOPSY Bilateral 03/18/2018   Procedure: RIGHT NIPPLE SPARING MASTECTOMY WITH RIGHT AXILLARY SENTINEL LYMPH NODE BIOPSY AND LEFT RISK REDUCING NIPPLE SPARING MASTECTOMY;  Surgeon: Rolm Bookbinder, MD;  Location: [facility];  Service: General;  Laterality: Bilateral;  . RADIOACTIVE SEED GUIDED EXCISIONAL BREAST BIOPSY Right 12/08/2014   Procedure: RADIOACTIVE SEED GUIDED EXCISIONAL BREAST BIOPSY;  Surgeon: Rolm Bookbinder, MD;  Location: [facility];  Service: General;  Laterality: Right;  . REMOVAL OF BILATERAL TISSUE EXPANDERS WITH PLACEMENT OF BILATERAL BREAST IMPLANTS Bilateral 06/27/2018   Procedure: REMOVAL OF BILATERAL TISSUE EXPANDERS WITH PLACEMENT OF BILATERAL BREAST IMPLANTS;  Surgeon: Irene Limbo, MD;  Location: [facility];  Service: Plastics;  Laterality: Bilateral;     Current Outpatient Medications:  .  ADVAIR DISKUS 100-50 MCG/DOSE AEPB, INHALE 1 PUFF INTO THE LUNGS EVERY 12 (TWELVE) HOURS. RINSE MOUTH AFTER EACH USE, Disp: 180 each, Rfl: 0 .  albuterol (PROAIR HFA) 108 (90 Base) MCG/ACT inhaler, Inhale 2 puffs into the lungs every 6 (six) hours as needed for wheezing or shortness of breath., Disp: 18 g, Rfl: 1 .  BLACK COHOSH COMPOUND PO, Take by mouth., Disp: , Rfl:  .  cetirizine (ZYRTEC) 10 MG tablet, Take 10 mg by mouth daily as needed. , Disp: , Rfl:  .  chlorpheniramine-HYDROcodone (TUSSIONEX PENNKINETIC ER) 10-8 MG/5ML SUER, Take 5 mLs by mouth every 12 (twelve) hours as needed for cough. Caution of sedation, Disp: 60 mL, Rfl: 0 .  ibuprofen (ADVIL,MOTRIN) 200  MG tablet, Take 800 mg by mouth every 6 (six) hours as needed for pain. Reported on 01/12/2015, Disp: , Rfl:  .  naproxen sodium (ALEVE) 220 MG tablet, Take 220 mg by mouth., Disp: , Rfl:  .  NON FORMULARY, Supplement for menopause, Disp: , Rfl:  .  predniSONE (DELTASONE) 10 MG tablet, Take 4 pills once daily  by mouth for 3 days, then 3 pills daily for 3 days, then 2 pills daily for 3 days then 1 pill daily for 3 days then stop, Disp: 30 tablet, Rfl: 0  EXAM:  VITALS per patient if applicable:  GENERAL: alert, oriented, appears well and in no acute distress  HEENT: atraumatic, conjunttiva clear, no obvious abnormalities on inspection of external nose and ears  NECK: normal movements of the head and neck  LUNGS: on inspection no signs of respiratory distress, breathing rate appears normal, no obvious gross SOB, gasping or wheezing  CV: no obvious cyanosis  MS: moves all visible extremities without noticeable abnormality  PSYCH/NEURO: pleasant and cooperative, no obvious depression or anxiety, speech and thought processing grossly intact  ASSESSMENT AND PLAN:  Discussed the following assessment and plan:  Cough  Nasal congestion  -we discussed possible serious and likely etiologies, options for evaluation and workup, limitations of telemedicine visit vs in person visit, treatment, treatment risks and precautions. Pt prefers to treat via telemedicine empirically rather than in person at this moment. Query influenza, VURI, possible covid (though less likely given had covid last month) vs other. Discussed tamiflu - she opted against. Discussed covid testing with antigen testing would be best as PCR could still be positive. Offered cough medication, but she feels ok with OTC meds for now.  Work/School slipped offered:  declined Scheduled follow up with PCP offered: advised to schedule folllow up with PCP if needed Advised to seek prompt in person care if worsening, new symptoms arise, or if is not improving with treatment.   I discussed the assessment and treatment plan with the patient. The patient was provided an opportunity to ask questions and all were answered. The patient agreed with the plan and demonstrated an understanding of the instructions.     Lucretia Kern, DO

## 2020-03-29 NOTE — Patient Instructions (Signed)
  HOME CARE TIPS:  -[place] testing information: https://www.[facility].org/ OR [phone] Most pharmacies also offer testing and home test kits. If you do covid testing, ensure if an antigen test given recent covid infection.   -can use tylenol or aleve if needed for fevers, aches and pains per instructions  -can use nasal saline a few times per day if you have nasal congestion; sometimes  a short course of Afrin nasal spray for 3 days can help with symptoms as well  -stay hydrated, drink plenty of fluids and eat small healthy meals - avoid dairy  -can take 1000 IU (16mcg) Vit D3 and 100-500 mg of Vit C daily per instructions  -If the Covid test is positive, follow up with your primary care office or [place] for a video visit.   -follow up with your doctor in 2-3 days unless improving and feeling better  -stay home while sick, except to seek medical care  It was nice to meet you today, and I really hope you are feeling better soon. I help [facility] out with telemedicine visits on Tuesdays and Thursdays and am available for visits on those days. If you have any concerns or questions following this visit please schedule a follow up visit with your Primary Care doctor or seek care at a local urgent care clinic to avoid delays in care.    Seek in person care or schedule a follow up video visit promptly if your symptoms worsen, new concerns arise or you are not improving with treatment. Call 911 and/or seek emergency care if your symptoms are severe or life threatening.

## 2020-03-29 NOTE — Telephone Encounter (Signed)
Patient has a virtual visit with Dr. Maudie Mercury today at 10:40.

## 2020-04-01 ENCOUNTER — Telehealth: Payer: Self-pay

## 2020-04-01 DIAGNOSIS — R059 Cough, unspecified: Secondary | ICD-10-CM

## 2020-04-01 MED ORDER — HYDROCOD POLST-CPM POLST ER 10-8 MG/5ML PO SUER: 5.0000 mL | Freq: Two times a day (BID) | ORAL | 0 refills | Status: AC | PRN

## 2020-04-01 NOTE — Telephone Encounter (Signed)
Please call patient and notify her that we sent the [facility].

## 2020-04-01 NOTE — Telephone Encounter (Signed)
Pt notified Rx sent to pharmacy

## 2020-04-01 NOTE — Telephone Encounter (Signed)
Pt said she had video visit with Dr Maudie Mercury on 03/29/20 and pt did not have covid test and was advised if not better to FU with PCP in 2 - 3 days. Pt said her cough has gone into bronchitis and Dr Glori Bickers always gives pt tussionex for her cough. DR Glori Bickers is out of office and not on computer. Pt request note sent to provider at [facility] to have Tussionex sent to [place] if sent before noon today or [facility] if after 12 noon. Pt has to meet contractor later today. Pt said fever and other symptoms are gone except the dry cough and head congestion. Pt request cb after reviewed by provider. Pt said mentioned to Dr Maudie Mercury at video appt about getting tussionex for cough but pt was advised to ck with Dr Glori Bickers. Sending note to Gentry Fitz NP who is in office.  Name of Medication: tussionex pennkinetic ER 10-8 mg/5 ml Name of Pharmacy: depends on time of day; see phonenote Last Fill or Written Date and Quantity: # 60 ml on 01/14/20 Last Office Visit and Type: 03/29/20 video with Dr Maudie Mercury Next Office Visit and Type: none scheduled

## 2022-01-16 LAB — HM COLONOSCOPY

## 2022-02-02 ENCOUNTER — Encounter: Payer: Self-pay | Admitting: Gastroenterology

## 2022-02-02 NOTE — Progress Notes (Signed)
Tried to reach pt by phone to discuss- someone answers but never speaks.  I will try again later.

## 2022-02-02 NOTE — Progress Notes (Signed)
Review of records to be scanned into the chart  January 2024 colonoscopy performed for screening  Two 2 to 4 mm polyps in the cecum, removed with cold biopsy forceps. 1, 10 mm polyp at the appendiceal orifice.  Biopsied. 1, 12 mm polyp at the hepatic flexure removed with hot snare. Internal hemorrhoids. The examined portion of the ileum was normal.  Pathology Cecum polypectomy tubular adenomas Large intestine appendiceal orifice polyp sessile serrated adenoma Large intestine hepatic flexure tubular adenoma  Looking at the images it looks like this polyp is hiding underneath the fold of the appendiceal orifice itself.  It is not clear if this is completely going into the orifice deeper or not.  I am willing to see the patient for consideration of advanced resection attempt and for potential use of OVESCO FTRD. We will set her up for a clinic visit and a colonoscopy if she desires.  Please proceed with scheduling.   Justice Britain, MD [facility] Office # [phone]

## 2022-02-05 ENCOUNTER — Other Ambulatory Visit: Payer: Self-pay

## 2022-02-05 DIAGNOSIS — Z86010 Personal history of colonic polyps: Secondary | ICD-10-CM

## 2022-02-05 MED ORDER — NA SULFATE-K SULFATE-MG SULF 17.5-3.13-1.6 GM/177ML PO SOLN: 1.0000 | Freq: Once | ORAL | 0 refills | Status: AC

## 2022-02-05 NOTE — Progress Notes (Signed)
Attempted to reach the pt again Left message on machine to call back

## 2022-02-05 NOTE — Progress Notes (Signed)
Letters have been mailed to the pt with all appt information.

## 2022-02-05 NOTE — Progress Notes (Signed)
Colon EMR has been scheduled for 04/05/22 at 730 am   Left message on machine to call back

## 2022-02-05 NOTE — Progress Notes (Signed)
Line busy

## 2022-02-05 NOTE — Progress Notes (Signed)
ROV has been scheduled for 03/28/22 at 1010 am

## 2022-02-06 NOTE — Progress Notes (Signed)
Left message on machine to call back  

## 2022-02-07 ENCOUNTER — Telehealth: Payer: Self-pay | Admitting: Gastroenterology

## 2022-02-07 NOTE — Progress Notes (Signed)
All information has been mailed to the pt  I have sent to My Chart however the pt does not view the messages

## 2022-02-07 NOTE — Progress Notes (Signed)
Tried to reach the pt again no answer and message left. I also called the work number but was told that the pt is no longer employed at that office.

## 2022-02-07 NOTE — Telephone Encounter (Signed)
Patient returning Nichole Arnold's call. Patient states she will not be available until after 2pm today. Please advise.

## 2022-02-07 NOTE — Telephone Encounter (Signed)
Review of records to be scanned into the chart   January 2024 colonoscopy performed for screening   Two 2 to 4 mm polyps in the cecum, removed with cold biopsy forceps. 1, 10 mm polyp at the appendiceal orifice.  Biopsied. 1, 12 mm polyp at the hepatic flexure removed with hot snare. Internal hemorrhoids. The examined portion of the ileum was normal.   Pathology Cecum polypectomy tubular adenomas Large intestine appendiceal orifice polyp sessile serrated adenoma Large intestine hepatic flexure tubular adenoma   Looking at the images it looks like this polyp is hiding underneath the fold of the appendiceal orifice itself.  It is not clear if this is completely going into the orifice deeper or not.   I am willing to see the patient for consideration of advanced resection attempt and for potential use of OVESCO FTRD. We will set her up for a clinic visit and a colonoscopy if she desires.   Please proceed with scheduling.     Justice Britain, MD [facility] Office # [phone]     Colon EMR has been scheduled for 04/05/22 at 730 am    Left message on machine to call back

## 2022-02-08 NOTE — Telephone Encounter (Signed)
Left message on machine to call back  

## 2022-02-09 NOTE — Telephone Encounter (Signed)
Colon EMR scheduled, pt instructed and medications reviewed.  Patient instructions mailed to home.  Patient to call with any questions or concerns.   She will pick up the prep and have on hand   She also is aware of the appt with GM on 3/20

## 2022-03-26 ENCOUNTER — Telehealth: Payer: Self-pay | Admitting: Gastroenterology

## 2022-03-26 NOTE — Telephone Encounter (Signed)
PT has a colonoscopy scheduled at [facility] on 3/28 and needs to have it rescheduled. She would like to know what dates they may have for late May early June considering she has changed her consultation to June. Please advise.

## 2022-03-26 NOTE — Telephone Encounter (Signed)
I have spoken with the pt and she has been rescheduled to 6/10 for colon EMR. She will keep the appt with Dr Rush Landmark in the office as scheduled.

## 2022-03-28 ENCOUNTER — Ambulatory Visit: Payer: 59 | Admitting: Gastroenterology

## 2022-05-31 ENCOUNTER — Telehealth: Payer: Self-pay

## 2022-05-31 DIAGNOSIS — K388 Other specified diseases of appendix: Secondary | ICD-10-CM

## 2022-05-31 NOTE — Telephone Encounter (Signed)
I called the pt to set up the CT scan and she tells me that she wants to reschedule the office visit and procedure until September.  I have moved the appts and also made her aware that she will be called to setup CT scan a week or so prior to her office visit.

## 2022-05-31 NOTE — Telephone Encounter (Signed)
-----   Message from Lemar Lofty., MD sent at 05/31/2022  4:41 AM EDT ----- Regarding: Upcoming clinic and procedure Nichole Arnold, I was reviewing upcoming patients and procedures. This patient was referred for an appendiceal orifice polyp. I see her clinic visit as scheduled and then her procedure within a week. These patients to have potential polyps of the appendix orifice, need to have a CT abdomen/pelvis performed to ensure that there is no evidence of mucocele or dilation of the appendix where in which surgical management may be best approached. Please help work with the patient to get this scheduled around the time of her clinic visit or before her procedure so that we have the best information and can offer the patient the safest attempt at resection. Thanks. GM

## 2022-05-31 NOTE — Telephone Encounter (Signed)
Patty, I am placing Dr. Bosie Clos on here so he is aware of the patient's further decision to postpone the procedure. I will see her in September. GM

## 2022-06-12 ENCOUNTER — Ambulatory Visit: Payer: 59 | Admitting: Gastroenterology

## 2022-09-03 ENCOUNTER — Telehealth: Payer: Self-pay

## 2022-09-03 NOTE — Telephone Encounter (Signed)
CT has been scheduled and pt advised

## 2022-09-03 NOTE — Telephone Encounter (Signed)
-----   Message from Nurse Maliq Pilley P sent at 05/31/2022 10:11 AM EDT ----- Pt needs CT scan see 5/23 note- order has been entered

## 2022-09-11 ENCOUNTER — Ambulatory Visit (HOSPITAL_COMMUNITY)
Admission: RE | Admit: 2022-09-11 | Discharge: 2022-09-11 | Disposition: A | Discharge status: Home / Self Care | Payer: Self-pay | Source: Ambulatory Visit | Attending: Gastroenterology | Admitting: Gastroenterology

## 2022-09-11 DIAGNOSIS — K388 Other specified diseases of appendix: Secondary | ICD-10-CM | POA: Insufficient documentation

## 2022-09-11 MED ORDER — IOHEXOL 300 MG/ML  SOLN
100.0000 mL | Freq: Once | INTRAMUSCULAR | Status: AC | PRN
  Administered 2022-09-11: 100 mL via INTRAVENOUS

## 2022-09-11 MED ORDER — IOHEXOL 9 MG/ML PO SOLN
ORAL | Status: AC
  Filled 2022-09-11: qty 1000

## 2022-09-11 MED ORDER — IOHEXOL 9 MG/ML PO SOLN
1000.0000 mL | ORAL | Status: AC
  Administered 2022-09-11: 1000 mL via ORAL

## 2022-09-11 MED ORDER — SODIUM CHLORIDE (PF) 0.9 % IJ SOLN
INTRAMUSCULAR | Status: AC
  Filled 2022-09-11: qty 50

## 2022-09-14 NOTE — Telephone Encounter (Signed)
Patient is returning your call said she will not be available until after 1: 30 pm.

## 2022-09-14 NOTE — Telephone Encounter (Signed)
See results note dated 9/6

## 2022-09-19 ENCOUNTER — Ambulatory Visit (INDEPENDENT_AMBULATORY_CARE_PROVIDER_SITE_OTHER): Payer: Self-pay | Admitting: Gastroenterology

## 2022-09-19 ENCOUNTER — Encounter: Payer: Self-pay | Admitting: Gastroenterology

## 2022-09-19 ENCOUNTER — Other Ambulatory Visit (INDEPENDENT_AMBULATORY_CARE_PROVIDER_SITE_OTHER): Payer: Self-pay

## 2022-09-19 VITALS — BP 94/70 | HR 73 | Ht 70.0 in | Wt 234.0 lb

## 2022-09-19 DIAGNOSIS — K635 Polyp of colon: Secondary | ICD-10-CM

## 2022-09-19 DIAGNOSIS — K5190 Ulcerative colitis, unspecified, without complications: Secondary | ICD-10-CM | POA: Insufficient documentation

## 2022-09-19 DIAGNOSIS — Z800 Family history of malignant neoplasm of digestive organs: Secondary | ICD-10-CM

## 2022-09-19 DIAGNOSIS — Z86010 Personal history of colonic polyps: Secondary | ICD-10-CM

## 2022-09-19 DIAGNOSIS — D126 Benign neoplasm of colon, unspecified: Secondary | ICD-10-CM

## 2022-09-19 LAB — CBC
HCT: 40.2 % (ref 36.0–46.0)
Hemoglobin: 13.3 g/dL (ref 12.0–15.0)
MCHC: 33.1 g/dL (ref 30.0–36.0)
MCV: 93.2 fl (ref 78.0–100.0)
Platelets: 348 10*3/uL (ref 150.0–400.0)
RBC: 4.31 Mil/uL (ref 3.87–5.11)
RDW: 13.2 % (ref 11.5–15.5)
WBC: 5.5 10*3/uL (ref 4.0–10.5)

## 2022-09-19 LAB — BASIC METABOLIC PANEL
BUN: 14 mg/dL (ref 6–23)
CO2: 29 meq/L (ref 19–32)
Calcium: 9.7 mg/dL (ref 8.4–10.5)
Chloride: 101 meq/L (ref 96–112)
Creatinine, Ser: 0.92 mg/dL (ref 0.40–1.20)
GFR: 69.61 mL/min (ref >60.00)
Glucose, Bld: 95 mg/dL (ref 70–99)
Potassium: 4.2 meq/L (ref 3.5–5.1)
Sodium: 137 meq/L (ref 135–145)

## 2022-09-19 MED ORDER — NA SULFATE-K SULFATE-MG SULF 17.5-3.13-1.6 GM/177ML PO SOLN: 1.0000 | ORAL | 0 refills | Status: DC

## 2022-09-19 NOTE — Progress Notes (Signed)
GASTROENTEROLOGY OUTPATIENT CLINIC VISIT   Primary Care Provider Assunta Curtis, CRNP [address] [place] [address] [phone]  Referring Provider Dr. Bosie Clos  Patient Profile: Nichole Arnold is a 57 y.o. female with a pmh significant for allergies, anxiety, asthma prior [facility], family history Colon Polyps (TA's and SSA of the appendiceal orifice still in situ), stomach cancer (M/MGM/PGF).  The patient presents to the [facility] for an evaluation and management of problem(s) noted below:  Problem List 1. Appendiceal orifice polyp   2. Serrated adenoma of colon   3. Hx of adenomatous colonic polyps   4. Family history of gastric cancer     History of Present Illness This is the patient's first visit to the outpatient GI clinic.  The patient underwent a screening colonoscopy by Dr. Bosie Clos in January 2024.  She was found to have multiple polyps.  110 mm polyp at the appendiceal orifice was semisessile and biopsied and returned as a sessile serrated adenoma.  It is for this reason that the patient was referred for an attempt at advanced resection of the appendiceal orifice polyp.  We have tried to have the patient come in earlier this year but due to scheduling conflicts this was not possible.  The patient does not have any other significant GI symptoms at this time.   GI Review of Systems Positive as above Negative for dysphagia, odynophagia, nausea, vomiting, pain hematochezia  Review of Systems General: Denies fevers/chills/weight loss unintentionally Cardiovascular: Denies chest pain Pulmonary: Denies shortness of breath Gastroenterological: See HPI Genitourinary: Denies darkened urine Hematological: Denies easy bruising/bleeding Dermatological: Denies jaundice Psychological: Mood is stable   Medications Current Outpatient Medications  Medication Sig Dispense Refill   BLACK COHOSH COMPOUND PO Take by mouth.     Na Sulfate-K Sulfate-Mg  Sulf (SUPREP BOWEL PREP KIT) 17.5-3.13-1.6 GM/177ML SOLN Take 1 kit by mouth as directed. For colonoscopy prep 354 mL 0   chlorpheniramine-HYDROcodone (TUSSIONEX PENNKINETIC ER) 10-8 MG/5ML SUER Take 5 mLs by mouth every 12 (twelve) hours as needed for cough. Caution of sedation (Patient not taking: Reported on 09/19/2022) 60 mL 0   No current facility-administered medications for this visit.    Allergies No Known Allergies  Histories Past Medical History:  Diagnosis Date   Allergic rhinitis    Allergy    Anxiety    Asthma    allergy induced   Basal cell carcinoma of ear    Left   Basal cell carcinoma of forehead    Benign paroxysmal positional vertigo    Bronchitis 02/17/2018   Past Surgical History:  Procedure Laterality Date   BASAL CELL CARCINOMA EXCISION  9/08   left ear   BREAST RECONSTRUCTION WITH PLACEMENT OF TISSUE EXPANDER AND ALLODERM Bilateral 03/18/2018   Procedure: BILATERAL BREAST RECONSTRUCTION WITH PLACEMENT OF TISSUE EXPANDER AND ALLODERM;  Surgeon: Glenna Fellows, MD;  Location: [facility];  Service: Plastics;  Laterality: Bilateral;   BREAST SURGERY     CERVICAL CONE BIOPSY     CESAREAN SECTION  12/00   MELANOMA EXCISION     pre melanoma   NEUROMA SURGERY  6/02   finger   NIPPLE SPARING MASTECTOMY WITH SENTINEL LYMPH NODE BIOPSY Bilateral 03/18/2018   Procedure: RIGHT NIPPLE SPARING MASTECTOMY WITH RIGHT AXILLARY SENTINEL LYMPH NODE BIOPSY AND LEFT RISK REDUCING NIPPLE SPARING MASTECTOMY;  Surgeon: Emelia Loron, MD;  Location: [facility];  Service: General;  Laterality: Bilateral;   RADIOACTIVE SEED GUIDED EXCISIONAL BREAST  BIOPSY Right 12/08/2014   Procedure: RADIOACTIVE SEED GUIDED EXCISIONAL BREAST BIOPSY;  Surgeon: Emelia Loron, MD;  Location: [facility];  Service: General;  Laterality: Right;   REMOVAL OF BILATERAL TISSUE EXPANDERS WITH PLACEMENT OF BILATERAL BREAST IMPLANTS Bilateral 06/27/2018    Procedure: REMOVAL OF BILATERAL TISSUE EXPANDERS WITH PLACEMENT OF BILATERAL BREAST IMPLANTS;  Surgeon: Glenna Fellows, MD;  Location: [facility];  Service: Plastics;  Laterality: Bilateral;   Social History   Socioeconomic History   Marital status: Divorced    Spouse name: Not on file   Number of children: Not on file   Years of education: Not on file   Highest education level: Not on file  Occupational History    Employer: OTHER    Comment: [employer]  Tobacco Use   Smoking status: Never   Smokeless tobacco: Never  Vaping Use   Vaping status: Never Used  Substance and Sexual Activity   Alcohol use: Yes    Alcohol/week: 0.0 standard drinks of alcohol    Comment: Occasional/ Social   Drug use: No   Sexual activity: Yes    Birth control/protection: None  Other Topics Concern   Not on file  Social History Narrative   Married      Works at [employer]   Social Determinants of Corporate investment banker Strain: Not on file  Food Insecurity: Not on file  Transportation Needs: Not on file  Physical Activity: Not on file  Stress: Not on file  Social Connections: Not on file  Intimate Partner Violence: Not on file   Family History  Problem Relation Age of Onset   Asthma Mother    Stomach cancer Mother    Colon polyps Father    Healthy Sister    Breast cancer Maternal Grandmother 75   Uterine cancer Maternal Grandmother    Stomach cancer Maternal Grandfather    Stomach cancer Paternal Grandfather    Heart failure Other        GM   Colon cancer Neg Hx    Esophageal cancer Neg Hx    Inflammatory bowel disease Neg Hx    Liver disease Neg Hx    Pancreatic cancer Neg Hx    Rectal cancer Neg Hx    I have reviewed her medical, social, and family history in detail and updated the electronic medical record as necessary.    PHYSICAL EXAMINATION  BP 94/70   Pulse 73   Ht 5\' 10"  (1.778 m)   Wt 234 lb (106.1 kg)   BMI 33.58 kg/m  Wt Readings  from Last 3 Encounters:  09/19/22 234 lb (106.1 kg)  06/27/18 222 lb 0.1 oz (100.7 kg)  03/18/18 231 lb 4.2 oz (104.9 kg)  GEN: NAD, appears stated age, doesn't appear chronically ill PSYCH: Cooperative, without pressured speech EYE: Conjunctivae pink, sclerae anicteric ENT: MMM CV: Nontachycardic RESP: No audible wheezing GI: NABS, soft, NT/ND, without rebound or guarding MSK/EXT: No significant lower extremity edema SKIN: No jaundice NEURO:  Alert & Oriented x 3, no focal deficits   REVIEW OF DATA  I reviewed the following data at the time of this encounter:  GI Procedures and Studies  January 2024 colonoscopy performed for screening   Two 2 to 4 mm polyps in the cecum, removed with cold biopsy forceps. 1, 10 mm polyp at the appendiceal orifice.  Biopsied. 1, 12 mm polyp at the hepatic flexure removed with hot snare. Internal hemorrhoids. The examined portion of the  ileum was normal.   Pathology Cecum polypectomy tubular adenomas Large intestine appendiceal orifice polyp sessile serrated adenoma Large intestine hepatic flexure tubular adenoma  Laboratory Studies  Reviewed those in epic  Imaging Studies  9/24 CTAP IMPRESSION: No radiographic evidence of appendiceal mass or metastatic disease. 13 mm indeterminate lesion in upper pole of right kidney. Differential diagnosis includes focal pyelonephritis, renal infarct, and renal neoplasm. Recommend correlation with urinalysis, and consider abdomen MRI without and with contrast for further characterization.   ASSESSMENT  Ms. Teaff is a 57 y.o. female with a pmh significant for allergies, anxiety, asthma prior [facility], family history Colon Polyps (TA's and SSA of the appendiceal orifice still in situ), stomach cancer (M/MGM/PGF).  The patient is seen today for evaluation and management of:  1. Appendiceal orifice polyp   2. Serrated adenoma of colon   3. Hx of adenomatous colonic polyps   4. Family history of gastric  cancer    The patient is hemodynamically stable at this time.  Based upon the description and endoscopic pictures I do feel that it is reasonable to pursue an Advanced Polypectomy attempt of the polyp/lesion.  We discussed some of the techniques of advanced polypectomy which include Endoscopic Mucosal Resection, OVESCO Full-Thickness Resection, Endorotor Morcellation, and Tissue Ablation via Fulguration.  We also reviewed images of typical techniques as noted above.  The risks and benefits of endoscopic evaluation were discussed with the patient; these include but are not limited to the risk of perforation, infection, bleeding, missed lesions, lack of diagnosis, severe illness requiring hospitalization, as well as anesthesia and sedation related illnesses.  Patient is also aware that with the use of the OVESCO full-thickness resection device, that inpatient to have appendiceal orifice polyps, there is a risk of appendicitis in the literature between 15 and 25% (with some medication with use of antibiotic therapy).  During attempts at advanced resection, the risks of bleeding and perforation/leak are increased as opposed to diagnostic and screening procedures, and that was discussed with the patient as well.   In addition, I explained that with the possible need for piecemeal resection, subsequent short-interval endoscopic evaluation for follow up and potential retreatment of the lesion/area may be necessary.  I did offer, a referral to surgery in order for patient to have opportunity to discuss surgical management/intervention prior to finalizing decision for attempt at endoscopic removal, however, the patient deferred on this.  If, after attempt at removal of the polyp/lesion, it is found that the patient has a complication or that an invasive lesion or malignant lesion is found, or that the polyp/lesion continues to recur, the patient is aware and understands that surgery may still be indicated/required.  I also  think with the patient's family history of gastric cancer, at some point she will benefit from an upper endoscopy be performed-that can be considered with her primary gastroenterologist or at a subsequent endoscopic procedure date (I cannot add on at the time of her current scheduled procedure).  All patient questions were answered, to the best of my ability, and the patient agrees to the aforementioned plan of action with follow-up as indicated.   PLAN  Preprocedure labs as outlined below Proceed with scheduled colonoscopy with EMR attempt of appendiceal orifice polyp Follow-up to be dictated based on results of pathology   Orders Placed This Encounter  Procedures   CBC   Basic Metabolic Panel (BMET)   Ambulatory referral to Gastroenterology    New Prescriptions   NA SULFATE-K SULFATE-MG SULF (SUPREP  BOWEL PREP KIT) 17.5-3.13-1.6 GM/177ML SOLN    Take 1 kit by mouth as directed. For colonoscopy prep   Modified Medications   No medications on file    Planned Follow Up No follow-ups on file.   Total Time in Face-to-Face and in Coordination of Care for patient including independent/personal interpretation/review of prior testing, medical history, examination, medication adjustment, communicating results with the patient directly, and documentation within the EHR is 45 minutes.   Corliss Parish, MD [facility] Office # [phone]

## 2022-09-19 NOTE — H&P (View-Only) (Signed)
GASTROENTEROLOGY OUTPATIENT CLINIC VISIT   Primary Care Provider Assunta Curtis, CRNP [address] [place] [address] [phone]  Referring Provider Dr. Bosie Clos  Patient Profile: Nichole Arnold is a 57 y.o. female with a pmh significant for allergies, anxiety, asthma prior [facility], family history Colon Polyps (TA's and SSA of the appendiceal orifice still in situ), stomach cancer (M/MGM/PGF).  The patient presents to the [facility] for an evaluation and management of problem(s) noted below:  Problem List 1. Appendiceal orifice polyp   2. Serrated adenoma of colon   3. Hx of adenomatous colonic polyps   4. Family history of gastric cancer     History of Present Illness This is the patient's first visit to the outpatient GI clinic.  The patient underwent a screening colonoscopy by Dr. Bosie Clos in January 2024.  She was found to have multiple polyps.  110 mm polyp at the appendiceal orifice was semisessile and biopsied and returned as a sessile serrated adenoma.  It is for this reason that the patient was referred for an attempt at advanced resection of the appendiceal orifice polyp.  We have tried to have the patient come in earlier this year but due to scheduling conflicts this was not possible.  The patient does not have any other significant GI symptoms at this time.   GI Review of Systems Positive as above Negative for dysphagia, odynophagia, nausea, vomiting, pain hematochezia  Review of Systems General: Denies fevers/chills/weight loss unintentionally Cardiovascular: Denies chest pain Pulmonary: Denies shortness of breath Gastroenterological: See HPI Genitourinary: Denies darkened urine Hematological: Denies easy bruising/bleeding Dermatological: Denies jaundice Psychological: Mood is stable   Medications Current Outpatient Medications  Medication Sig Dispense Refill   BLACK COHOSH COMPOUND PO Take by mouth.     Na Sulfate-K Sulfate-Mg  Sulf (SUPREP BOWEL PREP KIT) 17.5-3.13-1.6 GM/177ML SOLN Take 1 kit by mouth as directed. For colonoscopy prep 354 mL 0   chlorpheniramine-HYDROcodone (TUSSIONEX PENNKINETIC ER) 10-8 MG/5ML SUER Take 5 mLs by mouth every 12 (twelve) hours as needed for cough. Caution of sedation (Patient not taking: Reported on 09/19/2022) 60 mL 0   No current facility-administered medications for this visit.    Allergies No Known Allergies  Histories Past Medical History:  Diagnosis Date   Allergic rhinitis    Allergy    Anxiety    Asthma    allergy induced   Basal cell carcinoma of ear    Left   Basal cell carcinoma of forehead    Benign paroxysmal positional vertigo    Bronchitis 02/17/2018   Past Surgical History:  Procedure Laterality Date   BASAL CELL CARCINOMA EXCISION  9/08   left ear   BREAST RECONSTRUCTION WITH PLACEMENT OF TISSUE EXPANDER AND ALLODERM Bilateral 03/18/2018   Procedure: BILATERAL BREAST RECONSTRUCTION WITH PLACEMENT OF TISSUE EXPANDER AND ALLODERM;  Surgeon: Glenna Fellows, MD;  Location: [facility];  Service: Plastics;  Laterality: Bilateral;   BREAST SURGERY     CERVICAL CONE BIOPSY     CESAREAN SECTION  12/00   MELANOMA EXCISION     pre melanoma   NEUROMA SURGERY  6/02   finger   NIPPLE SPARING MASTECTOMY WITH SENTINEL LYMPH NODE BIOPSY Bilateral 03/18/2018   Procedure: RIGHT NIPPLE SPARING MASTECTOMY WITH RIGHT AXILLARY SENTINEL LYMPH NODE BIOPSY AND LEFT RISK REDUCING NIPPLE SPARING MASTECTOMY;  Surgeon: Emelia Loron, MD;  Location: [facility];  Service: General;  Laterality: Bilateral;   RADIOACTIVE SEED GUIDED EXCISIONAL BREAST  BIOPSY Right 12/08/2014   Procedure: RADIOACTIVE SEED GUIDED EXCISIONAL BREAST BIOPSY;  Surgeon: Emelia Loron, MD;  Location: [facility];  Service: General;  Laterality: Right;   REMOVAL OF BILATERAL TISSUE EXPANDERS WITH PLACEMENT OF BILATERAL BREAST IMPLANTS Bilateral 06/27/2018    Procedure: REMOVAL OF BILATERAL TISSUE EXPANDERS WITH PLACEMENT OF BILATERAL BREAST IMPLANTS;  Surgeon: Glenna Fellows, MD;  Location: [facility];  Service: Plastics;  Laterality: Bilateral;   Social History   Socioeconomic History   Marital status: Divorced    Spouse name: Not on file   Number of children: Not on file   Years of education: Not on file   Highest education level: Not on file  Occupational History    Employer: OTHER    Comment: [employer]  Tobacco Use   Smoking status: Never   Smokeless tobacco: Never  Vaping Use   Vaping status: Never Used  Substance and Sexual Activity   Alcohol use: Yes    Alcohol/week: 0.0 standard drinks of alcohol    Comment: Occasional/ Social   Drug use: No   Sexual activity: Yes    Birth control/protection: None  Other Topics Concern   Not on file  Social History Narrative   Married      Works at [employer]   Social Determinants of Corporate investment banker Strain: Not on file  Food Insecurity: Not on file  Transportation Needs: Not on file  Physical Activity: Not on file  Stress: Not on file  Social Connections: Not on file  Intimate Partner Violence: Not on file   Family History  Problem Relation Age of Onset   Asthma Mother    Stomach cancer Mother    Colon polyps Father    Healthy Sister    Breast cancer Maternal Grandmother 75   Uterine cancer Maternal Grandmother    Stomach cancer Maternal Grandfather    Stomach cancer Paternal Grandfather    Heart failure Other        GM   Colon cancer Neg Hx    Esophageal cancer Neg Hx    Inflammatory bowel disease Neg Hx    Liver disease Neg Hx    Pancreatic cancer Neg Hx    Rectal cancer Neg Hx    I have reviewed her medical, social, and family history in detail and updated the electronic medical record as necessary.    PHYSICAL EXAMINATION  BP 94/70   Pulse 73   Ht 5\' 10"  (1.778 m)   Wt 234 lb (106.1 kg)   BMI 33.58 kg/m  Wt Readings  from Last 3 Encounters:  09/19/22 234 lb (106.1 kg)  06/27/18 222 lb 0.1 oz (100.7 kg)  03/18/18 231 lb 4.2 oz (104.9 kg)  GEN: NAD, appears stated age, doesn't appear chronically ill PSYCH: Cooperative, without pressured speech EYE: Conjunctivae pink, sclerae anicteric ENT: MMM CV: Nontachycardic RESP: No audible wheezing GI: NABS, soft, NT/ND, without rebound or guarding MSK/EXT: No significant lower extremity edema SKIN: No jaundice NEURO:  Alert & Oriented x 3, no focal deficits   REVIEW OF DATA  I reviewed the following data at the time of this encounter:  GI Procedures and Studies  January 2024 colonoscopy performed for screening   Two 2 to 4 mm polyps in the cecum, removed with cold biopsy forceps. 1, 10 mm polyp at the appendiceal orifice.  Biopsied. 1, 12 mm polyp at the hepatic flexure removed with hot snare. Internal hemorrhoids. The examined portion of the  ileum was normal.   Pathology Cecum polypectomy tubular adenomas Large intestine appendiceal orifice polyp sessile serrated adenoma Large intestine hepatic flexure tubular adenoma  Laboratory Studies  Reviewed those in epic  Imaging Studies  9/24 CTAP IMPRESSION: No radiographic evidence of appendiceal mass or metastatic disease. 13 mm indeterminate lesion in upper pole of right kidney. Differential diagnosis includes focal pyelonephritis, renal infarct, and renal neoplasm. Recommend correlation with urinalysis, and consider abdomen MRI without and with contrast for further characterization.   ASSESSMENT  Nichole Arnold is a 57 y.o. female with a pmh significant for allergies, anxiety, asthma prior BCC, family history Colon Polyps (TA's and SSA of the appendiceal orifice still in situ), stomach cancer (M/MGM/PGF).  The patient is seen today for evaluation and management of:  1. Appendiceal orifice polyp   2. Serrated adenoma of colon   3. Hx of adenomatous colonic polyps   4. Family history of gastric  cancer    The patient is hemodynamically stable at this time.  Based upon the description and endoscopic pictures I do feel that it is reasonable to pursue an Advanced Polypectomy attempt of the polyp/lesion.  We discussed some of the techniques of advanced polypectomy which include Endoscopic Mucosal Resection, OVESCO Full-Thickness Resection, Endorotor Morcellation, and Tissue Ablation via Fulguration.  We also reviewed images of typical techniques as noted above.  The risks and benefits of endoscopic evaluation were discussed with the patient; these include but are not limited to the risk of perforation, infection, bleeding, missed lesions, lack of diagnosis, severe illness requiring hospitalization, as well as anesthesia and sedation related illnesses.  Patient is also aware that with the use of the OVESCO full-thickness resection device, that inpatient to have appendiceal orifice polyps, there is a risk of appendicitis in the literature between 15 and 25% (with some medication with use of antibiotic therapy).  During attempts at advanced resection, the risks of bleeding and perforation/leak are increased as opposed to diagnostic and screening procedures, and that was discussed with the patient as well.   In addition, I explained that with the possible need for piecemeal resection, subsequent short-interval endoscopic evaluation for follow up and potential retreatment of the lesion/area may be necessary.  I did offer, a referral to surgery in order for patient to have opportunity to discuss surgical management/intervention prior to finalizing decision for attempt at endoscopic removal, however, the patient deferred on this.  If, after attempt at removal of the polyp/lesion, it is found that the patient has a complication or that an invasive lesion or malignant lesion is found, or that the polyp/lesion continues to recur, the patient is aware and understands that surgery may still be indicated/required.  I also  think with the patient's family history of gastric cancer, at some point she will benefit from an upper endoscopy be performed-that can be considered with her primary gastroenterologist or at a subsequent endoscopic procedure date (I cannot add on at the time of her current scheduled procedure).  All patient questions were answered, to the best of my ability, and the patient agrees to the aforementioned plan of action with follow-up as indicated.   PLAN  Preprocedure labs as outlined below Proceed with scheduled colonoscopy with EMR attempt of appendiceal orifice polyp Follow-up to be dictated based on results of pathology   Orders Placed This Encounter  Procedures   CBC   Basic Metabolic Panel (BMET)   Ambulatory referral to Gastroenterology    New Prescriptions   NA SULFATE-K SULFATE-MG SULF (SUPREP  BOWEL PREP KIT) 17.5-3.13-1.6 GM/177ML SOLN    Take 1 kit by mouth as directed. For colonoscopy prep   Modified Medications   No medications on file    Planned Follow Up No follow-ups on file.   Total Time in Face-to-Face and in Coordination of Care for patient including independent/personal interpretation/review of prior testing, medical history, examination, medication adjustment, communicating results with the patient directly, and documentation within the EHR is 45 minutes.   Corliss Parish, MD [facility] Advanced Endoscopy Office # [phone]

## 2022-09-19 NOTE — Patient Instructions (Signed)
Your provider has requested that you go to the basement level for lab work before leaving today. Press "B" on the elevator. The lab is located at the first door on the left as you exit the elevator.  We have sent the following medications to your pharmacy for you to pick up at your convenience: Suprep   You have been scheduled for a colonoscopy. Please follow written instructions given to you at your visit today.   Please pick up your prep supplies at the pharmacy within the next 1-3 days.  If you use inhalers (even only as needed), please bring them with you on the day of your procedure.  DO NOT TAKE 7 DAYS PRIOR TO TEST- Trulicity (dulaglutide) Ozempic, Wegovy (semaglutide) Mounjaro (tirzepatide) Bydureon Bcise (exanatide extended release)  DO NOT TAKE 1 DAY PRIOR TO YOUR TEST Rybelsus (semaglutide) Adlyxin (lixisenatide) Victoza (liraglutide) Byetta (exanatide) ___________________________________________________________________________  Due to recent changes in healthcare laws, you may see the results of your imaging and laboratory studies on MyChart before your provider has had a chance to review them.  We understand that in some cases there may be results that are confusing or concerning to you. Not all laboratory results come back in the same time frame and the provider may be waiting for multiple results in order to interpret others.  Please give [place] 48 hours in order for your provider to thoroughly review all the results before contacting the office for clarification of your results.   _______________________________________________________  If your blood pressure at your visit was 140/90 or greater, please contact your primary care physician to follow up on this.  _______________________________________________________  If you are age 56 or older, your body mass index should be between 23-30. Your Body mass index is 33.58 kg/m. If this is out of the aforementioned range  listed, please consider follow up with your Primary Care Provider.  If you are age 40 or younger, your body mass index should be between 19-25. Your Body mass index is 33.58 kg/m. If this is out of the aformentioned range listed, please consider follow up with your Primary Care Provider.   ________________________________________________________  The [facility] providers would like to encourage you to use [facility] to communicate with providers for non-urgent requests or questions.  Due to long hold times on the telephone, sending your provider a message by [facility] may be a faster and more efficient way to get a response.  Please allow 48 business hours for a response.  Please remember that this is for non-urgent requests.  _______________________________________________________  Thank you for choosing me and [facility].  Dr. Meridee Score

## 2022-09-24 ENCOUNTER — Other Ambulatory Visit: Payer: Self-pay

## 2022-09-24 ENCOUNTER — Encounter (HOSPITAL_COMMUNITY): Payer: Self-pay | Admitting: Gastroenterology

## 2022-09-24 DIAGNOSIS — D126 Benign neoplasm of colon, unspecified: Secondary | ICD-10-CM | POA: Insufficient documentation

## 2022-09-24 DIAGNOSIS — Z800 Family history of malignant neoplasm of digestive organs: Secondary | ICD-10-CM | POA: Insufficient documentation

## 2022-09-24 DIAGNOSIS — K635 Polyp of colon: Secondary | ICD-10-CM | POA: Insufficient documentation

## 2022-09-24 DIAGNOSIS — Z86010 Personal history of colonic polyps: Secondary | ICD-10-CM | POA: Insufficient documentation

## 2022-09-29 NOTE — Anesthesia Preprocedure Evaluation (Signed)
Anesthesia Evaluation  Patient identified by MRN, date of birth, ID band Patient awake    Reviewed: Allergy & Precautions, H&P , NPO status , Patient's Chart, lab work & pertinent test results  Airway Mallampati: II  TM Distance: >3 FB Neck ROM: Full    Dental no notable dental hx. (+) Teeth Intact, Dental Advisory Given   Pulmonary asthma    Pulmonary exam normal breath sounds clear to auscultation       Cardiovascular Exercise Tolerance: Good negative cardio ROS  Rhythm:Regular Rate:Normal     Neuro/Psych   Anxiety     negative neurological ROS     GI/Hepatic Neg liver ROS, PUD,,,  Endo/Other  negative endocrine ROS    Renal/GU negative Renal ROS  negative genitourinary   Musculoskeletal   Abdominal   Peds  Hematology negative hematology ROS (+)   Anesthesia Other Findings   Reproductive/Obstetrics negative OB ROS                             Anesthesia Physical Anesthesia Plan  ASA: 2  Anesthesia Plan: MAC   Post-op Pain Management: Minimal or no pain anticipated   Induction: Intravenous  PONV Risk Score and Plan: 2 and Propofol infusion and Treatment may vary due to age or medical condition  Airway Management Planned: Natural Airway and Simple Face Mask  Additional Equipment:   Intra-op Plan:   Post-operative Plan:   Informed Consent: I have reviewed the patients History and Physical, chart, labs and discussed the procedure including the risks, benefits and alternatives for the proposed anesthesia with the patient or authorized representative who has indicated his/her understanding and acceptance.     Dental advisory given  Plan Discussed with: CRNA  Anesthesia Plan Comments:        Anesthesia Quick Evaluation

## 2022-10-01 ENCOUNTER — Ambulatory Visit (HOSPITAL_COMMUNITY): Payer: Self-pay | Admitting: Anesthesiology

## 2022-10-01 ENCOUNTER — Ambulatory Visit (HOSPITAL_COMMUNITY)
Admission: RE | Admit: 2022-10-01 | Discharge: 2022-10-01 | Disposition: A | Discharge status: Home / Self Care | Payer: Self-pay | Attending: Gastroenterology | Admitting: Gastroenterology

## 2022-10-01 ENCOUNTER — Ambulatory Visit (HOSPITAL_BASED_OUTPATIENT_CLINIC_OR_DEPARTMENT_OTHER): Payer: Self-pay | Admitting: Anesthesiology

## 2022-10-01 ENCOUNTER — Encounter (HOSPITAL_COMMUNITY): Payer: Self-pay | Admitting: Gastroenterology

## 2022-10-01 ENCOUNTER — Encounter (HOSPITAL_COMMUNITY)
Admission: RE | Disposition: A | Discharge status: Home / Self Care | Payer: Self-pay | Source: Home / Self Care | Attending: Gastroenterology

## 2022-10-01 ENCOUNTER — Other Ambulatory Visit: Payer: Self-pay

## 2022-10-01 DIAGNOSIS — K641 Second degree hemorrhoids: Secondary | ICD-10-CM | POA: Insufficient documentation

## 2022-10-01 DIAGNOSIS — Z8711 Personal history of peptic ulcer disease: Secondary | ICD-10-CM | POA: Insufficient documentation

## 2022-10-01 DIAGNOSIS — Z1211 Encounter for screening for malignant neoplasm of colon: Secondary | ICD-10-CM

## 2022-10-01 DIAGNOSIS — R933 Abnormal findings on diagnostic imaging of other parts of digestive tract: Secondary | ICD-10-CM

## 2022-10-01 DIAGNOSIS — Z85820 Personal history of malignant melanoma of skin: Secondary | ICD-10-CM | POA: Insufficient documentation

## 2022-10-01 DIAGNOSIS — K635 Polyp of colon: Secondary | ICD-10-CM | POA: Insufficient documentation

## 2022-10-01 DIAGNOSIS — Z86010 Personal history of colonic polyps: Secondary | ICD-10-CM | POA: Insufficient documentation

## 2022-10-01 DIAGNOSIS — D123 Benign neoplasm of transverse colon: Secondary | ICD-10-CM

## 2022-10-01 DIAGNOSIS — D122 Benign neoplasm of ascending colon: Secondary | ICD-10-CM

## 2022-10-01 DIAGNOSIS — Z83719 Family history of colon polyps, unspecified: Secondary | ICD-10-CM | POA: Insufficient documentation

## 2022-10-01 DIAGNOSIS — Z800 Family history of malignant neoplasm of digestive organs: Secondary | ICD-10-CM | POA: Insufficient documentation

## 2022-10-01 DIAGNOSIS — K648 Other hemorrhoids: Secondary | ICD-10-CM

## 2022-10-01 DIAGNOSIS — D125 Benign neoplasm of sigmoid colon: Secondary | ICD-10-CM

## 2022-10-01 DIAGNOSIS — D121 Benign neoplasm of appendix: Secondary | ICD-10-CM | POA: Insufficient documentation

## 2022-10-01 DIAGNOSIS — D120 Benign neoplasm of cecum: Secondary | ICD-10-CM

## 2022-10-01 DIAGNOSIS — K388 Other specified diseases of appendix: Secondary | ICD-10-CM

## 2022-10-01 HISTORY — PX: POLYPECTOMY: SHX5525

## 2022-10-01 HISTORY — PX: SUBMUCOSAL LIFTING INJECTION: SHX6855

## 2022-10-01 HISTORY — PX: COLONOSCOPY WITH PROPOFOL: SHX5780

## 2022-10-01 HISTORY — PX: ENDOSCOPIC MUCOSAL RESECTION: SHX6839

## 2022-10-01 HISTORY — DX: Malignant neoplasm of unspecified site of unspecified female breast: C50.919

## 2022-10-01 HISTORY — PX: HEMOSTASIS CLIP PLACEMENT: SHX6857

## 2022-10-01 SURGERY — COLONOSCOPY WITH PROPOFOL
Anesthesia: Monitor Anesthesia Care

## 2022-10-01 MED ORDER — PROPOFOL 10 MG/ML IV BOLUS
INTRAVENOUS | Status: DC | PRN
  Administered 2022-10-01: 20 mg via INTRAVENOUS
  Administered 2022-10-01: 10 mg via INTRAVENOUS
  Administered 2022-10-01: 30 mg via INTRAVENOUS
  Administered 2022-10-01: 20 mg via INTRAVENOUS

## 2022-10-01 MED ORDER — SODIUM CHLORIDE 0.9 % IV SOLN: INTRAVENOUS | Status: DC

## 2022-10-01 MED ORDER — PROPOFOL 1000 MG/100ML IV EMUL
INTRAVENOUS | Status: AC
  Filled 2022-10-01: qty 100

## 2022-10-01 MED ORDER — PROPOFOL 500 MG/50ML IV EMUL
INTRAVENOUS | Status: AC
  Filled 2022-10-01: qty 50

## 2022-10-01 MED ORDER — CIPROFLOXACIN HCL 500 MG PO TABS: 500.0000 mg | ORAL_TABLET | Freq: Two times a day (BID) | ORAL | 0 refills | Status: AC

## 2022-10-01 MED ORDER — LIDOCAINE 2% (20 MG/ML) 5 ML SYRINGE
INTRAMUSCULAR | Status: DC | PRN
  Administered 2022-10-01: 100 mg via INTRAVENOUS

## 2022-10-01 MED ORDER — LACTATED RINGERS IV SOLN
INTRAVENOUS | Status: AC | PRN
Start: 2022-10-01 — End: 2022-10-01
  Administered 2022-10-01: 1000 mL via INTRAVENOUS

## 2022-10-01 MED ORDER — CIPROFLOXACIN IN D5W 400 MG/200ML IV SOLN
INTRAVENOUS | Status: DC | PRN
Start: 2022-10-01 — End: 2022-10-01
  Administered 2022-10-01: 400 mg via INTRAVENOUS

## 2022-10-01 MED ORDER — CIPROFLOXACIN IN D5W 400 MG/200ML IV SOLN
INTRAVENOUS | Status: AC
  Filled 2022-10-01: qty 200

## 2022-10-01 MED ORDER — PROPOFOL 500 MG/50ML IV EMUL
INTRAVENOUS | Status: DC | PRN
  Administered 2022-10-01: 120 ug/kg/min via INTRAVENOUS

## 2022-10-01 SURGICAL SUPPLY — 22 items

## 2022-10-01 NOTE — Anesthesia Postprocedure Evaluation (Signed)
Anesthesia Post Note  Patient: AVERYL ZERINGUE  Procedure(s) Performed: COLONOSCOPY WITH PROPOFOL ENDOSCOPIC MUCOSAL RESECTION POLYPECTOMY SUBMUCOSAL LIFTING INJECTION HEMOSTASIS CLIP PLACEMENT     Patient location during evaluation: PACU Anesthesia Type: MAC Level of consciousness: awake and alert Pain management: pain level controlled Vital Signs Assessment: post-procedure vital signs reviewed and stable Respiratory status: spontaneous breathing, nonlabored ventilation and respiratory function stable Cardiovascular status: stable and blood pressure returned to baseline Postop Assessment: no apparent nausea or vomiting Anesthetic complications: no  No notable events documented.  Last Vitals:  Vitals:   10/01/22 0900 10/01/22 0910  BP: 116/88 106/74  Pulse: 72 67  Resp: 20 14  Temp:    SpO2: 99% 99%    Last Pain:  Vitals:   10/01/22 0910  TempSrc:   PainSc: 0-No pain                 Pieter Fooks,W. EDMOND

## 2022-10-01 NOTE — Discharge Instructions (Signed)
YOU HAD AN ENDOSCOPIC PROCEDURE TODAY: Refer to the procedure report and other information in the discharge instructions given to you for any specific questions about what was found during the examination. If this information does not answer your questions, please call [place] office at [phone] to clarify.   YOU SHOULD EXPECT: Some feelings of bloating in the abdomen. Passage of more gas than usual. Walking can help get rid of the air that was put into your GI tract during the procedure and reduce the bloating. If you had a lower endoscopy (such as a colonoscopy or flexible sigmoidoscopy) you may notice spotting of blood in your stool or on the toilet paper. Some abdominal soreness may be present for a day or two, also.  DIET: Your first meal following the procedure should be a light meal and then it is ok to progress to your normal diet. A half-sandwich or bowl of soup is an example of a good first meal. Heavy or fried foods are harder to digest and may make you feel nauseous or bloated. Drink plenty of fluids but you should avoid alcoholic beverages for 24 hours. If you had a esophageal dilation, please see attached instructions for diet.    ACTIVITY: Your care partner should take you home directly after the procedure. You should plan to take it easy, moving slowly for the rest of the day. You can resume normal activity the day after the procedure however YOU SHOULD NOT DRIVE, use power tools, machinery or perform tasks that involve climbing or major physical exertion for 24 hours (because of the sedation medicines used during the test).   SYMPTOMS TO REPORT IMMEDIATELY: A gastroenterologist can be reached at any hour. Please call [phone]  for any of the following symptoms:  Following lower endoscopy (colonoscopy, flexible sigmoidoscopy) Excessive amounts of blood in the stool  Significant tenderness, worsening of abdominal pains  Swelling of the abdomen that is new, acute  Fever of 100 or  higher   FOLLOW UP:  If any biopsies were taken you will be contacted by phone or by letter within the next 1-3 weeks. Call [phone]  if you have not heard about the biopsies in 3 weeks.  Please also call with any specific questions about appointments or follow up tests.

## 2022-10-01 NOTE — Interval H&P Note (Signed)
History and Physical Interval Note:  10/01/2022 7:54 AM  Nichole Arnold  has presented today for surgery, with the diagnosis of colon polyps.  The various methods of treatment have been discussed with the patient and family. After consideration of risks, benefits and other options for treatment, the patient has consented to  Procedure(s): COLONOSCOPY WITH PROPOFOL (N/A) ENDOSCOPIC MUCOSAL RESECTION (N/A) as a surgical intervention.  The patient's history has been reviewed, patient examined, no change in status, stable for surgery.  I have reviewed the patient's chart and labs.  Questions were answered to the patient's satisfaction.     [employer]

## 2022-10-01 NOTE — Op Note (Signed)
[facility] Patient Name: Nichole Arnold Procedure Date: 10/01/2022 MRN: [id number] Attending MD: Corliss Parish , MD, [id number] Date of Birth: 07-16-1965 CSN: [id number] Age: 57 Admit Type: Ambulatory Procedure:                Colonoscopy Indications:              Excision of colonic polyp (appendiceal orifice                            polyp referred for resection) Providers:                Corliss Parish, MD, Norman Clay, RN, Marja Kays, Technician, Suzy Bouchard, RN Referring MD:             Shirley Friar, MD, [facility]. [facility] Medicines:                Monitored Anesthesia Care Complications:            No immediate complications. Estimated Blood Loss:     Estimated blood loss was minimal. Procedure:                Pre-Anesthesia Assessment:                           - Prior to the procedure, a History and Physical                            was performed, and patient medications and                            allergies were reviewed. The patient's tolerance of                            previous anesthesia was also reviewed. The risks                            and benefits of the procedure and the sedation                            options and risks were discussed with the patient.                            All questions were answered, and informed consent                            was obtained. Prior Anticoagulants: The patient has                            taken no anticoagulant or antiplatelet agents. ASA                            Grade Assessment: II - A patient with mild systemic  disease. After reviewing the risks and benefits,                            the patient was deemed in satisfactory condition to                            undergo the procedure.                           After obtaining informed consent, the colonoscope                            was passed under direct  vision. Throughout the                            procedure, the patient's blood pressure, pulse, and                            oxygen saturations were monitored continuously. The                            CF-HQ190L ([id number]) Olympus colonoscope was                            introduced through the anus and advanced to the the                            ascending colon. The colonoscopy was performed                            without difficulty. The patient tolerated the                            procedure. The quality of the bowel preparation was                            adequate. The terminal ileum, ileocecal valve,                            appendiceal orifice, and rectum were photographed. Scope In: 8:10:40 AM Scope Out: 8:47:34 AM Scope Withdrawal Time: 0 hours 32 minutes 36 seconds  Total Procedure Duration: 0 hours 36 minutes 54 seconds  Findings:      The digital rectal exam findings include hemorrhoids. Pertinent       negatives include no palpable rectal lesions.      A moderate amount of semi-liquid stool was found in the entire colon,       interfering with visualization. Lavage of the area was performed using       copious amounts, resulting in clearance with adequate visualization.      The terminal ileum and ileocecal valve appeared normal.      A 15 mm polyp was found in the region just below the appendiceal       orifice. The polyp was sessile. Preparations were made for mucosal       resection. Demarcation of the lesion  was performed with high-definition       white light and narrow band imaging to clearly identify the boundaries       of the lesion. EverLift was injected to raise the lesion. Piecemeal       mucosal resection using a snare was performed. Resection and retrieval       were complete. Resected tissue margins were examined and clear of polyp       tissue. Coagulation for tissue destruction using snare was successful.       To prevent bleeding after  mucosal resection, two hemostatic clips were       successfully placed (MR conditional). Clip manufacturer: [employer]. There was no bleeding at the end of the procedure.      Five sessile polyps were found in the transverse colon (1), ascending       colon (2) and cecum (2). The polyps were 2 to 6 mm in size. These polyps       were removed with a cold snare. Resection and retrieval were complete.      Normal mucosa was found in the entire colon otherwise.      Non-bleeding non-thrombosed internal hemorrhoids were found during       retroflexion, during perianal exam and during digital exam. The       hemorrhoids were Grade II (internal hemorrhoids that prolapse but reduce       spontaneously). Impression:               - Hemorrhoids found on digital rectal exam.                           - Stool in the entire examined colon.                           - The examined portion of the ileum was normal.                           - One 15 mm polyp at the appendiceal orifice,                            removed with mucosal resection. Resected and                            retrieved. Treated with a hot snare. Clips (MR                            conditional) were placed. Clip manufacturer: [employer].                           - Five, 2 to 6 mm polyps in the transverse colon,                            in the ascending colon and in the cecum, removed  with a cold snare. Resected and retrieved.                           - Normal mucosa in the entire examined colon                            otherwise.                           - Non-bleeding non-thrombosed internal hemorrhoids. Moderate Sedation:      Not Applicable - Patient had care per Anesthesia. Recommendation:           - The patient will be observed post-procedure,                            until all discharge criteria are met.                           - Discharge  patient to home.                           - Patient has a contact number available for                            emergencies. The signs and symptoms of potential                            delayed complications were discussed with the                            patient. Return to normal activities tomorrow.                            Written discharge instructions were provided to the                            patient.                           - High fiber diet.                           - Use FiberCon 1-2 tablets PO daily.                           - Ciprofloxacin 500 mg twice daily x 3 days to                            decrease risk of post interventional infectious                            risk/appendicitis.                           - Continue present medications.                           -  Await pathology results.                           - Repeat colonoscopy in likely 6-12 months for                            surveillance based on pathology results.                           - The findings and recommendations were discussed                            with the patient.                           - The findings and recommendations were discussed                            with the designated responsible adult. Procedure Code(s):        --- Professional ---                           [phone], 52, Colonoscopy, flexible; with endoscopic                            mucosal resection                           [phone], 59,52, Colonoscopy, flexible; with removal                            of tumor(s), polyp(s), or other lesion(s) by snare                            technique Diagnosis Code(s):        --- Professional ---                           K64.1, Second degree hemorrhoids                           D12.1, Benign neoplasm of appendix                           D12.3, Benign neoplasm of transverse colon (hepatic                            flexure or splenic flexure)                            D12.2, Benign neoplasm of ascending colon                           D12.0, Benign neoplasm of cecum                           K63.5, Polyp of colon CPT copyright 2022 [employer]  [employer]. All rights reserved. The codes documented in this report are preliminary and upon coder review may  be revised to meet current compliance requirements. Corliss Parish, MD 10/01/2022 9:07:32 AM Number of Addenda: 0

## 2022-10-01 NOTE — Transfer of Care (Signed)
Immediate Anesthesia Transfer of Care Note  Patient: Nichole Arnold  Procedure(s) Performed: COLONOSCOPY WITH PROPOFOL ENDOSCOPIC MUCOSAL RESECTION POLYPECTOMY SUBMUCOSAL LIFTING INJECTION HEMOSTASIS CLIP PLACEMENT  Patient Location: PACU  Anesthesia Type:MAC  Level of Consciousness: sedated  Airway & Oxygen Therapy: Patient Spontanous Breathing and Patient connected to face mask oxygen  Post-op Assessment: Report given to RN and Post -op Vital signs reviewed and stable  Post vital signs: Reviewed and stable  Last Vitals:  Vitals Value Taken Time  BP    Temp    Pulse 76 10/01/22 0852  Resp 18 10/01/22 0852  SpO2 100 % 10/01/22 0852  Vitals shown include unfiled device data.  Last Pain:  Vitals:   10/01/22 0648  TempSrc: Tympanic  PainSc: 0-No pain         Complications: No notable events documented.

## 2022-10-02 ENCOUNTER — Encounter: Payer: Self-pay | Admitting: Gastroenterology

## 2022-10-02 LAB — SURGICAL PATHOLOGY

## 2022-10-03 ENCOUNTER — Encounter (HOSPITAL_COMMUNITY): Payer: Self-pay | Admitting: Gastroenterology
